# Patient Record
Sex: Male | Born: 1985 | Race: Black or African American | Hispanic: No | Marital: Single
Health system: Southern US, Community
[De-identification: ages and names within clinical notes are randomized; demographics above are authoritative.]

---
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
## 2009-10-13 ENCOUNTER — Emergency Department (HOSPITAL_COMMUNITY): Admission: EM | Admit: 2009-10-13 | Discharge: 2009-10-13 | Payer: Self-pay | Admitting: Emergency Medicine

## 2009-10-13 ENCOUNTER — Inpatient Hospital Stay (HOSPITAL_COMMUNITY): Admission: RE | Admit: 2009-10-13 | Discharge: 2009-10-17 | Payer: Self-pay | Admitting: Psychiatry

## 2009-10-13 ENCOUNTER — Ambulatory Visit: Payer: Self-pay | Admitting: Psychiatry

## 2010-02-27 ENCOUNTER — Emergency Department (HOSPITAL_COMMUNITY): Admission: EM | Admit: 2010-02-27 | Discharge: 2010-02-27 | Payer: Self-pay | Admitting: Emergency Medicine

## 2011-01-14 LAB — CBC
HCT: 40.1 % (ref 39.0–52.0)
MCV: 93.9 fL (ref 78.0–100.0)
Platelets: 185 10*3/uL (ref 150–400)
RDW: 13.5 % (ref 11.5–15.5)
WBC: 5.4 10*3/uL (ref 4.0–10.5)

## 2011-01-14 LAB — COMPREHENSIVE METABOLIC PANEL
ALT: 18 U/L (ref 0–53)
Albumin: 4.1 g/dL (ref 3.5–5.2)
Alkaline Phosphatase: 38 U/L — ABNORMAL LOW (ref 39–117)
Calcium: 9.4 mg/dL (ref 8.4–10.5)
GFR calc non Af Amer: >60 mL/min (ref >60)
Potassium: 3.6 mEq/L (ref 3.5–5.1)
Sodium: 140 mEq/L (ref 135–145)

## 2011-04-09 ENCOUNTER — Ambulatory Visit (INDEPENDENT_AMBULATORY_CARE_PROVIDER_SITE_OTHER): Payer: Self-pay

## 2011-04-09 ENCOUNTER — Inpatient Hospital Stay (INDEPENDENT_AMBULATORY_CARE_PROVIDER_SITE_OTHER)
Admission: RE | Admit: 2011-04-09 | Discharge: 2011-04-09 | Disposition: A | Discharge status: Home / Self Care | Payer: Self-pay | Source: Ambulatory Visit | Attending: Emergency Medicine | Admitting: Emergency Medicine

## 2011-04-09 DIAGNOSIS — S61209A Unspecified open wound of unspecified finger without damage to nail, initial encounter: Secondary | ICD-10-CM

## 2011-04-09 DIAGNOSIS — S6710XA Crushing injury of unspecified finger(s), initial encounter: Secondary | ICD-10-CM

## 2019-12-23 ENCOUNTER — Encounter (HOSPITAL_COMMUNITY)
Admission: RE | Disposition: A | Discharge status: Home / Self Care | Payer: Self-pay | Source: Ambulatory Visit | Attending: Emergency Medicine

## 2019-12-23 ENCOUNTER — Encounter (HOSPITAL_COMMUNITY): Payer: Self-pay | Admitting: Surgery

## 2019-12-23 ENCOUNTER — Emergency Department (HOSPITAL_COMMUNITY): Payer: Self-pay

## 2019-12-23 ENCOUNTER — Emergency Department (HOSPITAL_COMMUNITY): Payer: Self-pay | Admitting: Anesthesiology

## 2019-12-23 ENCOUNTER — Observation Stay (HOSPITAL_COMMUNITY)
Admission: EM | Admit: 2019-12-23 | Discharge: 2019-12-24 | Disposition: A | Discharge status: Home / Self Care | Payer: Self-pay | Source: Home / Self Care | Attending: Emergency Medicine | Admitting: Emergency Medicine

## 2019-12-23 ENCOUNTER — Observation Stay (HOSPITAL_COMMUNITY)
Admission: RE | Admit: 2019-12-23 | Discharge: 2019-12-23 | Disposition: A | Discharge status: Home / Self Care | Payer: Self-pay | Source: Ambulatory Visit | Attending: Emergency Medicine | Admitting: Emergency Medicine

## 2019-12-23 ENCOUNTER — Encounter (HOSPITAL_COMMUNITY): Payer: Self-pay | Admitting: Anesthesiology

## 2019-12-23 ENCOUNTER — Other Ambulatory Visit: Payer: Self-pay

## 2019-12-23 ENCOUNTER — Encounter (HOSPITAL_COMMUNITY)
Admission: EM | Disposition: A | Discharge status: Home / Self Care | Payer: Self-pay | Source: Home / Self Care | Attending: Emergency Medicine

## 2019-12-23 DIAGNOSIS — Z20822 Contact with and (suspected) exposure to covid-19: Secondary | ICD-10-CM | POA: Insufficient documentation

## 2019-12-23 DIAGNOSIS — K3589 Other acute appendicitis without perforation or gangrene: Secondary | ICD-10-CM | POA: Insufficient documentation

## 2019-12-23 DIAGNOSIS — K353 Acute appendicitis with localized peritonitis, without perforation or gangrene: Secondary | ICD-10-CM

## 2019-12-23 DIAGNOSIS — Z79899 Other long term (current) drug therapy: Secondary | ICD-10-CM | POA: Insufficient documentation

## 2019-12-23 DIAGNOSIS — K3533 Acute appendicitis with perforation and localized peritonitis, with abscess: Principal | ICD-10-CM | POA: Insufficient documentation

## 2019-12-23 HISTORY — PX: LAPAROSCOPIC APPENDECTOMY: SHX408

## 2019-12-23 LAB — URINALYSIS, ROUTINE W REFLEX MICROSCOPIC
Bacteria, UA: NONE SEEN
Bilirubin Urine: NEGATIVE
Glucose, UA: NEGATIVE mg/dL
Ketones, ur: NEGATIVE mg/dL
Leukocytes,Ua: NEGATIVE
Nitrite: NEGATIVE
Protein, ur: NEGATIVE mg/dL
Specific Gravity, Urine: 1.01 (ref 1.005–1.030)
pH: 6 (ref 5.0–8.0)

## 2019-12-23 LAB — COMPREHENSIVE METABOLIC PANEL
ALT: 21 U/L (ref 0–44)
AST: 17 U/L (ref 15–41)
Albumin: 4.3 g/dL (ref 3.5–5.0)
Alkaline Phosphatase: 46 U/L (ref 38–126)
Anion gap: 9 (ref 5–15)
BUN: 6 mg/dL (ref 6–20)
CO2: 31 mmol/L (ref 22–32)
Calcium: 9.2 mg/dL (ref 8.9–10.3)
Chloride: 97 mmol/L — ABNORMAL LOW (ref 98–111)
Creatinine, Ser: 1.2 mg/dL (ref 0.61–1.24)
GFR calc Af Amer: >60 mL/min (ref >60)
GFR calc non Af Amer: >60 mL/min (ref >60)
Glucose, Bld: 113 mg/dL — ABNORMAL HIGH (ref 70–99)
Potassium: 3.1 mmol/L — ABNORMAL LOW (ref 3.5–5.1)
Sodium: 137 mmol/L (ref 135–145)
Total Bilirubin: 1.3 mg/dL — ABNORMAL HIGH (ref 0.3–1.2)
Total Protein: 7.5 g/dL (ref 6.5–8.1)

## 2019-12-23 LAB — DIFFERENTIAL
Abs Immature Granulocytes: 0.05 10*3/uL (ref 0.00–0.07)
Basophils Absolute: 0 10*3/uL (ref 0.0–0.1)
Basophils Relative: 0 %
Eosinophils Absolute: 0.1 10*3/uL (ref 0.0–0.5)
Eosinophils Relative: 0 %
Immature Granulocytes: 0 %
Lymphocytes Relative: 9 %
Lymphs Abs: 1.4 10*3/uL (ref 0.7–4.0)
Monocytes Absolute: 1.3 10*3/uL — ABNORMAL HIGH (ref 0.1–1.0)
Monocytes Relative: 8 %
Neutro Abs: 12.6 10*3/uL — ABNORMAL HIGH (ref 1.7–7.7)
Neutrophils Relative %: 83 %

## 2019-12-23 LAB — CBC
HCT: 44.8 % (ref 39.0–52.0)
Hemoglobin: 14.7 g/dL (ref 13.0–17.0)
MCH: 31.2 pg (ref 26.0–34.0)
MCHC: 32.8 g/dL (ref 30.0–36.0)
MCV: 95.1 fL (ref 80.0–100.0)
Platelets: 210 10*3/uL (ref 150–400)
RBC: 4.71 MIL/uL (ref 4.22–5.81)
RDW: 12.2 % (ref 11.5–15.5)
WBC: 15.1 10*3/uL — ABNORMAL HIGH (ref 4.0–10.5)
nRBC: 0 % (ref 0.0–0.2)

## 2019-12-23 LAB — RESPIRATORY PANEL BY RT PCR (FLU A&B, COVID)
Influenza A by PCR: NEGATIVE
Influenza B by PCR: NEGATIVE
SARS Coronavirus 2 by RT PCR: NEGATIVE

## 2019-12-23 LAB — LIPASE, BLOOD: Lipase: 22 U/L (ref 11–51)

## 2019-12-23 SURGERY — APPENDECTOMY, LAPAROSCOPIC
Anesthesia: General

## 2019-12-23 SURGERY — APPENDECTOMY, LAPAROSCOPIC
Anesthesia: General | Site: Abdomen

## 2019-12-23 MED ORDER — SUCCINYLCHOLINE CHLORIDE 200 MG/10ML IV SOSY
PREFILLED_SYRINGE | INTRAVENOUS | Status: DC | PRN
  Administered 2019-12-23: 120 mg via INTRAVENOUS

## 2019-12-23 MED ORDER — PHENYLEPHRINE 40 MCG/ML (10ML) SYRINGE FOR IV PUSH (FOR BLOOD PRESSURE SUPPORT)
PREFILLED_SYRINGE | INTRAVENOUS | Status: AC
  Filled 2019-12-23: qty 10

## 2019-12-23 MED ORDER — GABAPENTIN 300 MG PO CAPS: 300.0000 mg | ORAL_CAPSULE | ORAL | Status: AC

## 2019-12-23 MED ORDER — ACETAMINOPHEN 500 MG PO TABS
1000.0000 mg | ORAL_TABLET | Freq: Three times a day (TID) | ORAL | Status: DC
  Administered 2019-12-24 (×2): 1000 mg via ORAL
  Filled 2019-12-23 (×2): qty 2

## 2019-12-23 MED ORDER — LACTATED RINGERS IV SOLN: INTRAVENOUS | Status: DC

## 2019-12-23 MED ORDER — CELECOXIB 200 MG PO CAPS: 200.0000 mg | ORAL_CAPSULE | ORAL | Status: AC

## 2019-12-23 MED ORDER — OXYCODONE HCL 5 MG PO TABS: 5.0000 mg | ORAL_TABLET | ORAL | Status: DC | PRN

## 2019-12-23 MED ORDER — LIP MEDEX EX OINT
1.0000 "application " | TOPICAL_OINTMENT | Freq: Two times a day (BID) | CUTANEOUS | Status: DC
  Administered 2019-12-24: 1 via TOPICAL
  Filled 2019-12-23: qty 7

## 2019-12-23 MED ORDER — METRONIDAZOLE IN NACL 5-0.79 MG/ML-% IV SOLN
500.0000 mg | Freq: Once | INTRAVENOUS | Status: AC
  Administered 2019-12-23: 500 mg via INTRAVENOUS
  Filled 2019-12-23: qty 100

## 2019-12-23 MED ORDER — ACETAMINOPHEN 500 MG PO TABS: 1000.0000 mg | ORAL_TABLET | ORAL | Status: DC

## 2019-12-23 MED ORDER — SUGAMMADEX SODIUM 500 MG/5ML IV SOLN
INTRAVENOUS | Status: DC | PRN
  Administered 2019-12-23: 300 mg via INTRAVENOUS

## 2019-12-23 MED ORDER — ONDANSETRON 4 MG PO TBDP: 4.0000 mg | ORAL_TABLET | Freq: Four times a day (QID) | ORAL | Status: DC | PRN

## 2019-12-23 MED ORDER — MORPHINE SULFATE (PF) 4 MG/ML IV SOLN
4.0000 mg | INTRAVENOUS | Status: DC | PRN
  Administered 2019-12-23: 16:00:00 4 mg via INTRAVENOUS
  Filled 2019-12-23: qty 1

## 2019-12-23 MED ORDER — PROMETHAZINE HCL 25 MG/ML IJ SOLN: 6.2500 mg | INTRAMUSCULAR | Status: DC | PRN

## 2019-12-23 MED ORDER — GUAIFENESIN-DM 100-10 MG/5ML PO SYRP: 10.0000 mL | ORAL_SOLUTION | ORAL | Status: DC | PRN

## 2019-12-23 MED ORDER — GABAPENTIN 300 MG PO CAPS
300.0000 mg | ORAL_CAPSULE | Freq: Two times a day (BID) | ORAL | Status: DC
  Administered 2019-12-24: 300 mg via ORAL
  Filled 2019-12-23: qty 1

## 2019-12-23 MED ORDER — METHOCARBAMOL 500 MG PO TABS: 500.0000 mg | ORAL_TABLET | Freq: Four times a day (QID) | ORAL | Status: DC | PRN

## 2019-12-23 MED ORDER — DEXAMETHASONE SODIUM PHOSPHATE 10 MG/ML IJ SOLN
INTRAMUSCULAR | Status: AC
  Filled 2019-12-23: qty 1

## 2019-12-23 MED ORDER — PHENYLEPHRINE 40 MCG/ML (10ML) SYRINGE FOR IV PUSH (FOR BLOOD PRESSURE SUPPORT)
PREFILLED_SYRINGE | INTRAVENOUS | Status: DC | PRN
  Administered 2019-12-23: 120 ug via INTRAVENOUS

## 2019-12-23 MED ORDER — MENTHOL 3 MG MT LOZG: 1.0000 | LOZENGE | OROMUCOSAL | Status: DC | PRN

## 2019-12-23 MED ORDER — OXYCODONE HCL 5 MG/5ML PO SOLN: 5.0000 mg | Freq: Once | ORAL | Status: DC | PRN

## 2019-12-23 MED ORDER — MIDAZOLAM HCL 5 MG/5ML IJ SOLN
INTRAMUSCULAR | Status: DC | PRN
  Administered 2019-12-23: 2 mg via INTRAVENOUS

## 2019-12-23 MED ORDER — PHENOL 1.4 % MT LIQD: 1.0000 | OROMUCOSAL | Status: DC | PRN

## 2019-12-23 MED ORDER — MIDAZOLAM HCL 2 MG/2ML IJ SOLN
INTRAMUSCULAR | Status: AC
  Filled 2019-12-23: qty 2

## 2019-12-23 MED ORDER — FENTANYL CITRATE (PF) 100 MCG/2ML IJ SOLN: 25.0000 ug | INTRAMUSCULAR | Status: DC | PRN

## 2019-12-23 MED ORDER — SIMETHICONE 80 MG PO CHEW: 40.0000 mg | CHEWABLE_TABLET | Freq: Four times a day (QID) | ORAL | Status: DC | PRN

## 2019-12-23 MED ORDER — MAGIC MOUTHWASH
15.0000 mL | Freq: Four times a day (QID) | ORAL | Status: DC | PRN
  Filled 2019-12-23: qty 15

## 2019-12-23 MED ORDER — CHLORHEXIDINE GLUCONATE CLOTH 2 % EX PADS: 6.0000 | MEDICATED_PAD | Freq: Once | CUTANEOUS | Status: DC

## 2019-12-23 MED ORDER — MAGNESIUM HYDROXIDE 400 MG/5ML PO SUSP: 30.0000 mL | Freq: Three times a day (TID) | ORAL | Status: DC | PRN

## 2019-12-23 MED ORDER — PROCHLORPERAZINE EDISYLATE 10 MG/2ML IJ SOLN: 5.0000 mg | INTRAMUSCULAR | Status: DC | PRN

## 2019-12-23 MED ORDER — SODIUM CHLORIDE 0.9 % IV SOLN: 2.0000 g | INTRAVENOUS | Status: AC

## 2019-12-23 MED ORDER — IOHEXOL 300 MG/ML  SOLN
100.0000 mL | Freq: Once | INTRAMUSCULAR | Status: AC | PRN
  Administered 2019-12-23: 100 mL via INTRAVENOUS

## 2019-12-23 MED ORDER — SODIUM CHLORIDE 0.9 % IV SOLN
2.0000 g | Freq: Once | INTRAVENOUS | Status: AC
  Administered 2019-12-23: 2 g via INTRAVENOUS
  Filled 2019-12-23: qty 20

## 2019-12-23 MED ORDER — ENOXAPARIN SODIUM 40 MG/0.4ML ~~LOC~~ SOLN: 40.0000 mg | SUBCUTANEOUS | Status: DC

## 2019-12-23 MED ORDER — METRONIDAZOLE IN NACL 5-0.79 MG/ML-% IV SOLN
500.0000 mg | Freq: Three times a day (TID) | INTRAVENOUS | Status: DC
  Administered 2019-12-24: 500 mg via INTRAVENOUS
  Filled 2019-12-23: qty 100

## 2019-12-23 MED ORDER — ACETAMINOPHEN 500 MG PO TABS
1000.0000 mg | ORAL_TABLET | ORAL | Status: AC
  Administered 2019-12-23: 20:00:00 1000 mg via ORAL

## 2019-12-23 MED ORDER — ONDANSETRON HCL 4 MG/2ML IJ SOLN
INTRAMUSCULAR | Status: AC
  Filled 2019-12-23: qty 2

## 2019-12-23 MED ORDER — ROCURONIUM BROMIDE 10 MG/ML (PF) SYRINGE
PREFILLED_SYRINGE | INTRAVENOUS | Status: AC
  Filled 2019-12-23: qty 10

## 2019-12-23 MED ORDER — PROPOFOL 10 MG/ML IV BOLUS
INTRAVENOUS | Status: DC | PRN
  Administered 2019-12-23: 200 mg via INTRAVENOUS

## 2019-12-23 MED ORDER — SODIUM CHLORIDE (PF) 0.9 % IJ SOLN
INTRAMUSCULAR | Status: AC
  Filled 2019-12-23: qty 50

## 2019-12-23 MED ORDER — HYDROCORTISONE 1 % EX CREA
1.0000 "application " | TOPICAL_CREAM | Freq: Three times a day (TID) | CUTANEOUS | Status: DC | PRN
  Filled 2019-12-23: qty 28

## 2019-12-23 MED ORDER — LIDOCAINE 2% (20 MG/ML) 5 ML SYRINGE
INTRAMUSCULAR | Status: DC | PRN
  Administered 2019-12-23: 100 mg via INTRAVENOUS

## 2019-12-23 MED ORDER — SUCCINYLCHOLINE CHLORIDE 200 MG/10ML IV SOSY
PREFILLED_SYRINGE | INTRAVENOUS | Status: AC
  Filled 2019-12-23: qty 10

## 2019-12-23 MED ORDER — ACETAMINOPHEN 500 MG PO TABS
ORAL_TABLET | ORAL | Status: AC
  Filled 2019-12-23: qty 2

## 2019-12-23 MED ORDER — LIDOCAINE 2% (20 MG/ML) 5 ML SYRINGE
INTRAMUSCULAR | Status: AC
  Filled 2019-12-23: qty 10

## 2019-12-23 MED ORDER — ONDANSETRON HCL 4 MG/2ML IJ SOLN
4.0000 mg | Freq: Once | INTRAMUSCULAR | Status: AC
  Administered 2019-12-23: 4 mg via INTRAVENOUS
  Filled 2019-12-23: qty 2

## 2019-12-23 MED ORDER — DEXAMETHASONE SODIUM PHOSPHATE 10 MG/ML IJ SOLN
INTRAMUSCULAR | Status: DC | PRN
  Administered 2019-12-23: 10 mg via INTRAVENOUS

## 2019-12-23 MED ORDER — ALUM & MAG HYDROXIDE-SIMETH 200-200-20 MG/5ML PO SUSP: 30.0000 mL | Freq: Four times a day (QID) | ORAL | Status: DC | PRN

## 2019-12-23 MED ORDER — HYDROCORTISONE (PERIANAL) 2.5 % EX CREA
1.0000 "application " | TOPICAL_CREAM | Freq: Four times a day (QID) | CUTANEOUS | Status: DC | PRN
  Filled 2019-12-23: qty 28.35

## 2019-12-23 MED ORDER — SUGAMMADEX SODIUM 500 MG/5ML IV SOLN
INTRAVENOUS | Status: AC
  Filled 2019-12-23: qty 5

## 2019-12-23 MED ORDER — ONDANSETRON HCL 4 MG/2ML IJ SOLN
INTRAMUSCULAR | Status: DC | PRN
  Administered 2019-12-23: 4 mg via INTRAVENOUS

## 2019-12-23 MED ORDER — OXYCODONE HCL 5 MG PO TABS: 5.0000 mg | ORAL_TABLET | Freq: Once | ORAL | Status: DC | PRN

## 2019-12-23 MED ORDER — SODIUM CHLORIDE 0.9% FLUSH
3.0000 mL | Freq: Once | INTRAVENOUS | Status: AC
  Administered 2019-12-23: 16:00:00 3 mL via INTRAVENOUS

## 2019-12-23 MED ORDER — DIPHENHYDRAMINE HCL 50 MG/ML IJ SOLN: 12.5000 mg | Freq: Four times a day (QID) | INTRAMUSCULAR | Status: DC | PRN

## 2019-12-23 MED ORDER — BUPIVACAINE LIPOSOME 1.3 % IJ SUSP
20.0000 mL | Freq: Once | INTRAMUSCULAR | Status: DC
  Filled 2019-12-23: qty 20

## 2019-12-23 MED ORDER — BISACODYL 10 MG RE SUPP: 10.0000 mg | Freq: Every day | RECTAL | Status: DC | PRN

## 2019-12-23 MED ORDER — ONDANSETRON HCL 4 MG/2ML IJ SOLN: 4.0000 mg | Freq: Four times a day (QID) | INTRAMUSCULAR | Status: DC | PRN

## 2019-12-23 MED ORDER — DIPHENHYDRAMINE HCL 12.5 MG/5ML PO ELIX: 12.5000 mg | ORAL_SOLUTION | Freq: Four times a day (QID) | ORAL | Status: DC | PRN

## 2019-12-23 MED ORDER — LACTATED RINGERS IR SOLN
Status: DC | PRN
  Administered 2019-12-23: 1000 mL

## 2019-12-23 MED ORDER — ZOLPIDEM TARTRATE 5 MG PO TABS: 5.0000 mg | ORAL_TABLET | Freq: Every evening | ORAL | Status: DC | PRN

## 2019-12-23 MED ORDER — LACTATED RINGERS IV BOLUS: 1000.0000 mL | Freq: Three times a day (TID) | INTRAVENOUS | Status: DC | PRN

## 2019-12-23 MED ORDER — SODIUM CHLORIDE 0.9 % IV SOLN: 250.0000 mL | INTRAVENOUS | Status: DC | PRN

## 2019-12-23 MED ORDER — GABAPENTIN 300 MG PO CAPS
ORAL_CAPSULE | ORAL | Status: AC
  Administered 2019-12-23: 300 mg via ORAL
  Filled 2019-12-23: qty 1

## 2019-12-23 MED ORDER — FENTANYL CITRATE (PF) 250 MCG/5ML IJ SOLN
INTRAMUSCULAR | Status: AC
  Filled 2019-12-23: qty 5

## 2019-12-23 MED ORDER — SODIUM CHLORIDE 0.9% FLUSH
3.0000 mL | Freq: Two times a day (BID) | INTRAVENOUS | Status: DC
  Administered 2019-12-23: 3 mL via INTRAVENOUS

## 2019-12-23 MED ORDER — SODIUM CHLORIDE 0.9% FLUSH: 3.0000 mL | INTRAVENOUS | Status: DC | PRN

## 2019-12-23 MED ORDER — CELECOXIB 200 MG PO CAPS
ORAL_CAPSULE | ORAL | Status: AC
  Administered 2019-12-23: 20:00:00 200 mg via ORAL
  Filled 2019-12-23: qty 1

## 2019-12-23 MED ORDER — AMPHETAMINE-DEXTROAMPHETAMINE 10 MG PO TABS: 20.0000 mg | ORAL_TABLET | Freq: Every day | ORAL | Status: DC

## 2019-12-23 MED ORDER — FENTANYL CITRATE (PF) 250 MCG/5ML IJ SOLN
INTRAMUSCULAR | Status: DC | PRN
  Administered 2019-12-23 (×2): 100 ug via INTRAVENOUS

## 2019-12-23 MED ORDER — HYDROMORPHONE HCL 1 MG/ML IJ SOLN: 0.5000 mg | INTRAMUSCULAR | Status: DC | PRN

## 2019-12-23 MED ORDER — KETOROLAC TROMETHAMINE 30 MG/ML IJ SOLN: 30.0000 mg | Freq: Once | INTRAMUSCULAR | Status: DC | PRN

## 2019-12-23 MED ORDER — ROCURONIUM BROMIDE 10 MG/ML (PF) SYRINGE
PREFILLED_SYRINGE | INTRAVENOUS | Status: DC | PRN
  Administered 2019-12-23: 50 mg via INTRAVENOUS

## 2019-12-23 MED ORDER — BUPIVACAINE HCL 0.25 % IJ SOLN
INTRAMUSCULAR | Status: AC
  Filled 2019-12-23: qty 1

## 2019-12-23 SURGICAL SUPPLY — 52 items
APL PRP STRL LF DISP 70% ISPRP (MISCELLANEOUS) ×1
APPLIER CLIP 5 13 M/L LIGAMAX5 (MISCELLANEOUS)
APPLIER CLIP ROT 10 11.4 M/L (STAPLE)
APR CLP MED LRG 11.4X10 (STAPLE)
APR CLP MED LRG 5 ANG JAW (MISCELLANEOUS)
BAG SPEC RTRVL 10 TROC 200 (ENDOMECHANICALS) ×1
CABLE HIGH FREQUENCY MONO STRZ (ELECTRODE) ×2 IMPLANT
CHLORAPREP W/TINT 26 (MISCELLANEOUS) ×2 IMPLANT
CLIP APPLIE 5 13 M/L LIGAMAX5 (MISCELLANEOUS) IMPLANT
CLIP APPLIE ROT 10 11.4 M/L (STAPLE) IMPLANT
COVER SURGICAL LIGHT HANDLE (MISCELLANEOUS) ×2 IMPLANT
COVER WAND RF STERILE (DRAPES) IMPLANT
CUTTER FLEX LINEAR 45M (STAPLE) ×1 IMPLANT
DECANTER SPIKE VIAL GLASS SM (MISCELLANEOUS) ×2 IMPLANT
DEVICE TROCAR PUNCTURE CLOSURE (ENDOMECHANICALS) IMPLANT
DRAPE LAPAROSCOPIC ABDOMINAL (DRAPES) ×2 IMPLANT
DRAPE WARM FLUID 44X44 (DRAPES) ×2 IMPLANT
DRSG TEGADERM 2-3/8X2-3/4 SM (GAUZE/BANDAGES/DRESSINGS) ×3 IMPLANT
DRSG TEGADERM 4X4.75 (GAUZE/BANDAGES/DRESSINGS) ×4 IMPLANT
ELECT REM PT RETURN 15FT ADLT (MISCELLANEOUS) ×2 IMPLANT
ENDOLOOP SUT PDS II  0 18 (SUTURE)
ENDOLOOP SUT PDS II 0 18 (SUTURE) IMPLANT
GAUZE SPONGE 2X2 8PLY STRL LF (GAUZE/BANDAGES/DRESSINGS) ×1 IMPLANT
GLOVE ECLIPSE 8.0 STRL XLNG CF (GLOVE) ×2 IMPLANT
GLOVE INDICATOR 8.0 STRL GRN (GLOVE) ×2 IMPLANT
GOWN STRL REUS W/TWL XL LVL3 (GOWN DISPOSABLE) ×4 IMPLANT
IRRIG SUCT STRYKERFLOW 2 WTIP (MISCELLANEOUS) ×2
IRRIGATION SUCT STRKRFLW 2 WTP (MISCELLANEOUS) ×1 IMPLANT
KIT BASIN OR (CUSTOM PROCEDURE TRAY) ×2 IMPLANT
KIT TURNOVER KIT A (KITS) IMPLANT
PAD POSITIONING PINK XL (MISCELLANEOUS) ×2 IMPLANT
PENCIL SMOKE EVACUATOR (MISCELLANEOUS) IMPLANT
POUCH RETRIEVAL ECOSAC 10 (ENDOMECHANICALS) ×1 IMPLANT
POUCH RETRIEVAL ECOSAC 10MM (ENDOMECHANICALS) ×2
RELOAD 45 VASCULAR/THIN (ENDOMECHANICALS) IMPLANT
RELOAD STAPLE 45 2.5 WHT GRN (ENDOMECHANICALS) IMPLANT
RELOAD STAPLE 45 3.5 BLU ETS (ENDOMECHANICALS) IMPLANT
RELOAD STAPLE TA45 3.5 REG BLU (ENDOMECHANICALS) ×2 IMPLANT
SCISSORS LAP 5X35 DISP (ENDOMECHANICALS) ×3 IMPLANT
SET TUBE SMOKE EVAC HIGH FLOW (TUBING) ×2 IMPLANT
SHEARS HARMONIC ACE PLUS 36CM (ENDOMECHANICALS) IMPLANT
SLEEVE XCEL OPT CAN 5 100 (ENDOMECHANICALS) ×2 IMPLANT
SPONGE GAUZE 2X2 STER 10/PKG (GAUZE/BANDAGES/DRESSINGS) ×1
SUT MNCRL AB 4-0 PS2 18 (SUTURE) ×2 IMPLANT
SUT PDS AB 0 CT1 36 (SUTURE) IMPLANT
SUT PDS AB 1 CT1 27 (SUTURE) IMPLANT
SUT SILK 2 0 SH (SUTURE) IMPLANT
TOWEL OR 17X26 10 PK STRL BLUE (TOWEL DISPOSABLE) ×2 IMPLANT
TRAY FOLEY MTR SLVR 16FR STAT (SET/KITS/TRAYS/PACK) IMPLANT
TRAY LAPAROSCOPIC (CUSTOM PROCEDURE TRAY) ×2 IMPLANT
TROCAR BLADELESS OPT 5 100 (ENDOMECHANICALS) ×2 IMPLANT
TROCAR XCEL 12X100 BLDLESS (ENDOMECHANICALS) ×2 IMPLANT

## 2019-12-23 NOTE — Discharge Instructions (Signed)
SURGERY: POST OP INSTRUCTIONS °(Surgery for small bowel obstruction, colon resection, etc) ° ° °###################################################################### ° °EAT °Gradually transition to a high fiber diet with a fiber supplement over the next few days after discharge ° °WALK °Walk an hour a day.  Control your pain to do that.   ° °CONTROL PAIN °Control pain so that you can walk, sleep, tolerate sneezing/coughing, go up/down stairs. ° °HAVE A BOWEL MOVEMENT DAILY °Keep your bowels regular to avoid problems.  OK to try a laxative to override constipation.  OK to use an antidairrheal to slow down diarrhea.  Call if not better after 2 tries ° °CALL IF YOU HAVE PROBLEMS/CONCERNS °Call if you are still struggling despite following these instructions. °Call if you have concerns not answered by these instructions ° °###################################################################### ° ° °DIET °Follow a light diet the first few days at home.  Start with a bland diet such as soups, liquids, starchy foods, low fat foods, etc.  If you feel full, bloated, or constipated, stay on a ful liquid or pureed/blenderized diet for a few days until you feel better and no longer constipated. °Be sure to drink plenty of fluids every day to avoid getting dehydrated (feeling dizzy, not urinating, etc.). °Gradually add a fiber supplement to your diet over the next week.  Gradually get back to a regular solid diet.  Avoid fast food or heavy meals the first week as you are more likely to get nauseated. °It is expected for your digestive tract to need a few months to get back to normal.  It is common for your bowel movements and stools to be irregular.  You will have occasional bloating and cramping that should eventually fade away.  Until you are eating solid food normally, off all pain medications, and back to regular activities; your bowels will not be normal. °Focus on eating a low-fat, high fiber diet the rest of your life  (See Getting to Good Bowel Health, below). ° °CARE of your INCISION or WOUND °It is good for closed incision and even open wounds to be washed every day.  Shower every day.  Short baths are fine.  Wash the incisions and wounds clean with soap & water.    °If you have a closed incision(s), wash the incision with soap & water every day.  You may leave closed incisions open to air if it is dry.   You may cover the incision with clean gauze & replace it after your daily shower for comfort. °If you have skin tapes (Steristrips) or skin glue (Dermabond) on your incision, leave them in place.  They will fall off on their own like a scab.  You may trim any edges that curl up with clean scissors.  If you have staples, set up an appointment for them to be removed in the office in 10 days after surgery.  °If you have a drain, wash around the skin exit site with soap & water and place a new dressing of gauze or band aid around the skin every day.  Keep the drain site clean & dry.    °If you have an open wound with packing, see wound care instructions.  In general, it is encouraged that you remove your dressing and packing, shower with soap & water, and replace your dressing once a day.  Pack the wound with clean gauze moistened with normal (0.9%) saline to keep the wound moist & uninfected.  Pressure on the dressing for 30 minutes will stop most wound   bleeding.  Eventually your body will heal & pull the open wound closed over the next few months.  °Raw open wounds will occasionally bleed or secrete yellow drainage until it heals closed.  Drain sites will drain a little until the drain is removed.  Even closed incisions can have mild bleeding or drainage the first few days until the skin edges scab over & seal.   °If you have an open wound with a wound vac, see wound vac care instructions. ° ° ° ° °ACTIVITIES as tolerated °Start light daily activities --- self-care, walking, climbing stairs-- beginning the day after surgery.   Gradually increase activities as tolerated.  Control your pain to be active.  Stop when you are tired.  Ideally, walk several times a day, eventually an hour a day.   °Most people are back to most day-to-day activities in a few weeks.  It takes 4-8 weeks to get back to unrestricted, intense activity. °If you can walk 30 minutes without difficulty, it is safe to try more intense activity such as jogging, treadmill, bicycling, low-impact aerobics, swimming, etc. °Save the most intensive and strenuous activity for last (Usually 4-8 weeks after surgery) such as sit-ups, heavy lifting, contact sports, etc.  Refrain from any intense heavy lifting or straining until you are off narcotics for pain control.  You will have off days, but things should improve week-by-week. °DO NOT PUSH THROUGH PAIN.  Let pain be your guide: If it hurts to do something, don't do it.  Pain is your body warning you to avoid that activity for another week until the pain goes down. °You may drive when you are no longer taking narcotic prescription pain medication, you can comfortably wear a seatbelt, and you can safely make sudden turns/stops to protect yourself without hesitating due to pain. °You may have sexual intercourse when it is comfortable. If it hurts to do something, stop. ° °MEDICATIONS °Take your usually prescribed home medications unless otherwise directed.   °Blood thinners:  °Usually you can restart any strong blood thinners after the second postoperative day.  It is OK to take aspirin right away.    ° If you are on strong blood thinners (warfarin/Coumadin, Plavix, Xerelto, Eliquis, Pradaxa, etc), discuss with your surgeon, medicine PCP, and/or cardiologist for instructions on when to restart the blood thinner & if blood monitoring is needed (PT/INR blood check, etc).   ° ° °PAIN CONTROL °Pain after surgery or related to activity is often due to strain/injury to muscle, tendon, nerves and/or incisions.  This pain is usually  short-term and will improve in a few months.  °To help speed the process of healing and to get back to regular activity more quickly, DO THE FOLLOWING THINGS TOGETHER: °1. Increase activity gradually.  DO NOT PUSH THROUGH PAIN °2. Use Ice and/or Heat °3. Try Gentle Massage and/or Stretching °4. Take over the counter pain medication °5. Take Narcotic prescription pain medication for more severe pain ° °Good pain control = faster recovery.  It is better to take more medicine to be more active than to stay in bed all day to avoid medications. °1.  Increase activity gradually °Avoid heavy lifting at first, then increase to lifting as tolerated over the next 6 weeks. °Do not “push through” the pain.  Listen to your body and avoid positions and maneuvers than reproduce the pain.  Wait a few days before trying something more intense °Walking an hour a day is encouraged to help your body recover faster   and more safely.  Start slowly and stop when getting sore.  If you can walk 30 minutes without stopping or pain, you can try more intense activity (running, jogging, aerobics, cycling, swimming, treadmill, sex, sports, weightlifting, etc.) °Remember: If it hurts to do it, then don’t do it! °2. Use Ice and/or Heat °You will have swelling and bruising around the incisions.  This will take several weeks to resolve. °Ice packs or heating pads (6-8 times a day, 30-60 minutes at a time) will help sooth soreness & bruising. °Some people prefer to use ice alone, heat alone, or alternate between ice & heat.  Experiment and see what works best for you.  Consider trying ice for the first few days to help decrease swelling and bruising; then, switch to heat to help relax sore spots and speed recovery. °Shower every day.  Short baths are fine.  It feels good!  Keep the incisions and wounds clean with soap & water.   °3. Try Gentle Massage and/or Stretching °Massage at the area of pain many times a day °Stop if you feel pain - do not  overdo it °4. Take over the counter pain medication °This helps the muscle and nerve tissues become less irritable and calm down faster °Choose ONE of the following over-the-counter anti-inflammatory medications: °Acetaminophen 500mg tabs (Tylenol) 1-2 pills with every meal and just before bedtime (avoid if you have liver problems or if you have acetaminophen in you narcotic prescription) °Naproxen 220mg tabs (ex. Aleve, Naprosyn) 1-2 pills twice a day (avoid if you have kidney, stomach, IBD, or bleeding problems) °Ibuprofen 200mg tabs (ex. Advil, Motrin) 3-4 pills with every meal and just before bedtime (avoid if you have kidney, stomach, IBD, or bleeding problems) °Take with food/snack several times a day as directed for at least 2 weeks to help keep pain / soreness down & more manageable. °5. Take Narcotic prescription pain medication for more severe pain °A prescription for strong pain control is often given to you upon discharge (for example: oxycodone/Percocet, hydrocodone/Norco/Vicodin, or tramadol/Ultram) °Take your pain medication as prescribed. °Be mindful that most narcotic prescriptions contain Tylenol (acetaminophen) as well - avoid taking too much Tylenol. °If you are having problems/concerns with the prescription medicine (does not control pain, nausea, vomiting, rash, itching, etc.), please call us [phone] to see if we need to switch you to a different pain medicine that will work better for you and/or control your side effects better. °If you need a refill on your pain medication, you must call the office before 4 pm and on weekdays only.  By federal law, prescriptions for narcotics cannot be called into a pharmacy.  They must be filled out on paper & picked up from our office by the patient or authorized caretaker.  Prescriptions cannot be filled after 4 pm nor on weekends.   ° °WHEN TO CALL US [phone] °Severe uncontrolled or worsening pain  °Fever over 101 F (38.5 C) °Concerns with  the incision: Worsening pain, redness, rash/hives, swelling, bleeding, or drainage °Reactions / problems with new medications (itching, rash, hives, nausea, etc.) °Nausea and/or vomiting °Difficulty urinating °Difficulty breathing °Worsening fatigue, dizziness, lightheadedness, blurred vision °Other concerns °If you are not getting better after two weeks or are noticing you are getting worse, contact our office [phone] for further advice.  We may need to adjust your medications, re-evaluate you in the office, send you to the emergency room, or see what other things we can do to help. °The   clinic staff is available to answer your questions during regular business hours (8:30am-5pm).  Please don’t hesitate to call and ask to speak to one of our nurses for clinical concerns.    °A surgeon from [facility] is always on call at the hospitals 24 hours/day °If you have a medical emergency, go to the nearest emergency room or call 911. ° °FOLLOW UP in our office °One the day of your discharge from the hospital (or the next business weekday), please call [facility] to set up or confirm an appointment to see your surgeon in the office for a follow-up appointment.  Usually it is 2-3 weeks after your surgery.   °If you have skin staples at your incision(s), let the office know so we can set up a time in the office for the nurse to remove them (usually around 10 days after surgery). °Make sure that you call for appointments the day of discharge (or the next business weekday) from the hospital to ensure a convenient appointment time. °IF YOU HAVE DISABILITY OR FAMILY LEAVE FORMS, BRING THEM TO THE OFFICE FOR PROCESSING.  DO NOT GIVE THEM TO YOUR DOCTOR. ° °[facility] °[address], [address], [place], [place]  [address] ? °[phone] - Main °[phone] - Toll Free,  [phone] - Fax °[email or web address] ° °GETTING TO GOOD BOWEL HEALTH. °It is  expected for your digestive tract to need a few months to get back to normal.  It is common for your bowel movements and stools to be irregular.  You will have occasional bloating and cramping that should eventually fade away.  Until you are eating solid food normally, off all pain medications, and back to regular activities; your bowels will not be normal.   °Avoiding constipation °The goal: ONE SOFT BOWEL MOVEMENT A DAY!    °Drink plenty of fluids.  Choose water first. °TAKE A FIBER SUPPLEMENT EVERY DAY THE REST OF YOUR LIFE °During your first week back home, gradually add back a fiber supplement every day °Experiment which form you can tolerate.   There are many forms such as powders, tablets, wafers, gummies, etc °Psyllium bran (Metamucil), methylcellulose (Citrucel), Miralax or Glycolax, Benefiber, Flax Seed.  °Adjust the dose week-by-week (1/2 dose/day to 6 doses a day) until you are moving your bowels 1-2 times a day.  Cut back the dose or try a different fiber product if it is giving you problems such as diarrhea or bloating. °Sometimes a laxative is needed to help jump-start bowels if constipated until the fiber supplement can help regulate your bowels.  If you are tolerating eating & you are farting, it is okay to try a gentle laxative such as double dose MiraLax, prune juice, or Milk of Magnesia.  Avoid using laxatives too often. °Stool softeners can sometimes help counteract the constipating effects of narcotic pain medicines.  It can also cause diarrhea, so avoid using for too long. °If you are still constipated despite taking fiber daily, eating solids, and a few doses of laxatives, call our office. °Controlling diarrhea °Try drinking liquids and eating bland foods for a few days to avoid stressing your intestines further. °Avoid dairy products (especially milk & ice cream) for a short time.  The intestines often can lose the ability to digest lactose when stressed. °Avoid foods that cause gassiness or  bloating.  Typical foods include beans and other legumes, cabbage, broccoli, and dairy foods.  Avoid greasy, spicy, fast foods.  Every person has   some sensitivity to other foods, so listen to your body and avoid those foods that trigger problems for you. °Probiotics (such as active yogurt, Align, etc) may help repopulate the intestines and colon with normal bacteria and calm down a sensitive digestive tract °Adding a fiber supplement gradually can help thicken stools by absorbing excess fluid and retrain the intestines to act more normally.  Slowly increase the dose over a few weeks.  Too much fiber too soon can backfire and cause cramping & bloating. °It is okay to try and slow down diarrhea with a few doses of antidiarrheal medicines.   °Bismuth subsalicylate (ex. Kayopectate, Pepto Bismol) for a few doses can help control diarrhea.  Avoid if pregnant.   °Loperamide (Imodium) can slow down diarrhea.  Start with one tablet (2mg) first.  Avoid if you are having fevers or severe pain.  °ILEOSTOMY PATIENTS WILL HAVE CHRONIC DIARRHEA since their colon is not in use.    °Drink plenty of liquids.  You will need to drink even more glasses of water/liquid a day to avoid getting dehydrated. °Record output from your ileostomy.  Expect to empty the bag every 3-4 hours at first.  Most people with a permanent ileostomy empty their bag 4-6 times at the least.   °Use antidiarrheal medicine (especially Imodium) several times a day to avoid getting dehydrated.  Start with a dose at bedtime & breakfast.  Adjust up or down as needed.  Increase antidiarrheal medications as directed to avoid emptying the bag more than 8 times a day (every 3 hours). °Work with your wound ostomy nurse to learn care for your ostomy.  See ostomy care instructions. °TROUBLESHOOTING IRREGULAR BOWELS °1) Start with a soft & bland diet. No spicy, greasy, or fried foods.  °2) Avoid gluten/wheat or dairy products from diet to see if symptoms improve. °3) Miralax  17gm or flax seed mixed in 8oz. water or juice-daily. May use 2-4 times a day as needed. °4) Gas-X, Phazyme, etc. as needed for gas & bloating.  °5) Prilosec (omeprazole) over-the-counter as needed °6)  Consider probiotics (Align, Activa, etc) to help calm the bowels down ° °Call your doctor if you are getting worse or not getting better.  Sometimes further testing (cultures, endoscopy, X-ray studies, CT scans, bloodwork, etc.) may be needed to help diagnose and treat the cause of the diarrhea. °[facility] °[address], [address], [place], [place]  [address] °[phone] - Main.    °[phone]  - Toll Free.   [phone] - Fax °[email or web address] ° ° °

## 2019-12-23 NOTE — Plan of Care (Signed)
  Problem: Skin Integrity: Goal: Risk for impaired skin integrity will decrease Outcome: Progressing   Problem: Pain Managment: Goal: General experience of comfort will improve Outcome: Progressing   Problem: Elimination: Goal: Will not experience complications related to urinary retention Outcome: Progressing   Problem: Coping: Goal: Level of anxiety will decrease Outcome: Progressing   Problem: Nutrition: Goal: Adequate nutrition will be maintained Outcome: Progressing

## 2019-12-23 NOTE — Transfer of Care (Signed)
Immediate Anesthesia Transfer of Care Note  Patient: Brad Walters  Procedure(s) Performed: APPENDECTOMY LAPAROSCOPIC (N/A Abdomen)  Patient Location: PACU  Anesthesia Type:General  Level of Consciousness: drowsy and responds to stimulation  Airway & Oxygen Therapy: Patient Spontanous Breathing and Patient connected to face mask oxygen  Post-op Assessment: Report given to RN and Post -op Vital signs reviewed and stable  Post vital signs: Reviewed and stable  Last Vitals:  Vitals Value Taken Time  BP 107/70 12/23/19 2046  Temp    Pulse    Resp 14 12/23/19 2048  SpO2    Vitals shown include unvalidated device data.  Last Pain:  Vitals:   12/23/19 1741  TempSrc:   PainSc: 8          Complications: No apparent anesthesia complications

## 2019-12-23 NOTE — Anesthesia Preprocedure Evaluation (Signed)
Anesthesia Evaluation  Patient identified by MRN, date of birth, ID band Patient awake    Reviewed: Allergy & Precautions, NPO status , Patient's Chart, lab work & pertinent test results  Airway Mallampati: II  TM Distance: >3 FB Neck ROM: Full    Dental no notable dental hx.    Pulmonary neg pulmonary ROS,    Pulmonary exam normal breath sounds clear to auscultation       Cardiovascular negative cardio ROS Normal cardiovascular exam Rhythm:Regular Rate:Normal     Neuro/Psych negative neurological ROS  negative psych ROS   GI/Hepatic negative GI ROS, Neg liver ROS,   Endo/Other  negative endocrine ROS  Renal/GU negative Renal ROS  negative genitourinary   Musculoskeletal negative musculoskeletal ROS (+)   Abdominal   Peds negative pediatric ROS (+)  Hematology negative hematology ROS (+)   Anesthesia Other Findings   Reproductive/Obstetrics negative OB ROS                             Anesthesia Physical Anesthesia Plan  ASA: I  Anesthesia Plan: General   Post-op Pain Management:    Induction: Intravenous and Rapid sequence  PONV Risk Score and Plan: 2 and Ondansetron, Dexamethasone and Treatment may vary due to age or medical condition  Airway Management Planned: Oral ETT  Additional Equipment:   Intra-op Plan:   Post-operative Plan: Extubation in OR  Informed Consent: I have reviewed the patients History and Physical, chart, labs and discussed the procedure including the risks, benefits and alternatives for the proposed anesthesia with the patient or authorized representative who has indicated his/her understanding and acceptance.     Dental advisory given  Plan Discussed with: CRNA and Surgeon  Anesthesia Plan Comments:         Anesthesia Quick Evaluation  

## 2019-12-23 NOTE — Op Note (Signed)
12/23/2019  PATIENT:  Brad Walters  34 y.o. male  Patient Care Team: Patient, No Pcp Per as PCP - General (General Practice)  PRE-OPERATIVE DIAGNOSIS:  Acute appendicitis  POST-OPERATIVE DIAGNOSIS:  Acute perforated appendicitis with phlegmon  PROCEDURE:  APPENDECTOMY LAPAROSCOPIC  SURGEON:  Ardeth Sportsman, MD  ANESTHESIA:   local and general  EBL:  Total I/O In: 100 [I.V.:100] Out: 30 [Urine:25; Blood:5]  Delay start of Pharmacological VTE agent (>24hrs) due to surgical blood loss or risk of bleeding:  no  DRAINS: none   SPECIMEN:  APPENDIX  DISPOSITION OF SPECIMEN:  PATHOLOGY  COUNTS:  YES  PLAN OF CARE: Admit for overnight observation  PATIENT DISPOSITION:  PACU - hemodynamically stable.   INDICATIONS: Patient with concerning symptoms & work up suspicious for appendicitis.  Surgery was recommended:  The anatomy & physiology of the digestive tract was discussed.  The pathophysiology of appendicitis was discussed.  Natural history risks without surgery was discussed.   I feel the risks of no intervention will lead to serious problems that outweigh the operative risks; therefore, I recommended diagnostic laparoscopy with removal of appendix to remove the pathology.  Laparoscopic & open techniques were discussed.   I noted a good likelihood this will help address the problem.    Risks such as bleeding, infection, abscess, leak, reoperation, possible ostomy, hernia, heart attack, death, and other risks were discussed.  Goals of post-operative recovery were discussed as well.  We will work to minimize complications.  Questions were answered.  The patient expresses understanding & wishes to proceed with surgery.  OR FINDINGS: Appendix adherent to cecum and ascending colon with obvious phlegmon.  Some early necrosis suspicious for perforation.  Focal peritonitis with purulence.  No discrete abscess.  DESCRIPTION:   The patient was identified & brought into the  operating room. The patient was positioned supine with arms tucked. SCDs were active during the entire case. The patient underwent general anesthesia without any difficulty.  The abdomen was prepped and draped in a sterile fashion. A Surgical Timeout confirmed our plan.  I made a transverse incision through the superior umbilical fold.  I made a small transverse nick through the infraumbilical fascia and confirmed peritoneal entry.  I placed a 4mm port.  We induced carbon dioxide insufflation.  Camera inspection revealed no injury.  I placed additional ports under direct laparoscopic visualization.  I could see no obvious phlegmon in the right lower quadrant along the ascending colon suspicious for phlegmonous appendicitis.  I mobilized the terminal ileum to proximal ascending colon in a lateral to medial fashion.  I took care to avoid injuring any retroperitoneal structures.  I freed the appendix off its attachments to the ascending colon and cecal mesentery.  I elevated the appendix. I skeletonized and transected through the the mesoappendix. I was able to free off the base of the appendix which was still viable.  I stapled the appendix off the cecum using a laparoscopic stapler. I took a healthy cuff of viable cecum. I ligated the mesoappendix and assured hemostasis in the mesentery.  I placed the appendix inside an EcoSac bag and removed out the left suprapubic 12 mm port.  I did copious irrigation. Hemostasis was good in the mesoappendix, colon mesentery, and retroperitoneum. Staple line was intact on the cecum with no bleeding. I washed out the pelvis, retrohepatic space and right paracolic gutter. I washed out the left side as well.  Hemostasis is good. There was no perforation or injury.  Because the area cleaned up well after irrigation, I did not place a drain.   I aspirated the carbon dioxide. I removed the ports.  He was then therefore I could close the left lower quadrant stapler port site with 0  Vicryl interrupted suture I closed skin using 4-0 monocryl stitch.  Sterile dressings applied.  Patient was extubated and sent to the recovery room.  I suspect the patient is going used in the hospital at least overnight and will need antibiotics for 5 days.  I discussed operative findings, updated the patient's status, discussed probable steps to recovery, and gave postoperative recommendations to the patient's significant other, Molli Knock. Recommendations were made.  Questions were answered.  She expressed understanding & appreciation. Adin Hector, M.D., F.A.C.S. Gastrointestinal and Minimally Invasive Surgery [facility] [address] [place], [place] [address] [phone] Main / Paging  12/23/2019 8:39 PM

## 2019-12-23 NOTE — Anesthesia Procedure Notes (Signed)
Procedure Name: Intubation Date/Time: 12/23/2019 7:50 PM Performed by: Elyn Peers, CRNA Pre-anesthesia Checklist: Patient identified, Emergency Drugs available, Suction available, Patient being monitored and Timeout performed Patient Re-evaluated:Patient Re-evaluated prior to induction Oxygen Delivery Method: Circle system utilized Preoxygenation: Pre-oxygenation with 100% oxygen Induction Type: IV induction, Rapid sequence and Cricoid Pressure applied Laryngoscope Size: Miller and 3 Grade View: Grade I Tube type: Oral Tube size: 7.5 mm Number of attempts: 1 Airway Equipment and Method: Stylet Placement Confirmation: ETT inserted through vocal cords under direct vision,  positive ETCO2 and breath sounds checked- equal and bilateral Secured at: 23 cm Tube secured with: Tape Dental Injury: Teeth and Oropharynx as per pre-operative assessment

## 2019-12-23 NOTE — ED Provider Notes (Signed)
[facility] DEPT Provider Note   CSN: [id number] Arrival date & time: 12/23/19  1307     History Chief Complaint  Patient presents with  . Abdominal Pain    Brad Walters is a 34 y.o. male.  34 year old male presents with complaint of abdominal pain since yesterday associated nausea vomiting.  Patient states pain starts in his right lower quadrant and radiates around his abdomen, sharp in nature, worse with sitting up, walking, and movement, improved somewhat with lying supine.  Last bowel movement was 2 days ago, denies changes in bladder habits, fevers, chills.  No prior abdominal surgeries.  No other complaints or concerns.        No past medical history on file.  There are no problems to display for this patient.   No family history on file.  Social History   Tobacco Use  . Smoking status: Not on file  Substance Use Topics  . Alcohol use: Not on file  . Drug use: Not on file    Home Medications Prior to Admission medications   Medication Sig Start Date End Date Taking? Authorizing Provider  amphetamine-dextroamphetamine (ADDERALL) 20 MG tablet Take 20 mg by mouth daily.   Yes [provider]    Allergies    Other  Review of Systems   Review of Systems  Constitutional: Negative for fever.  Respiratory: Negative for shortness of breath.   Cardiovascular: Negative for chest pain.  Gastrointestinal: Positive for abdominal pain, constipation, nausea and vomiting. Negative for blood in stool and diarrhea.  Genitourinary: Negative for dysuria, frequency and hematuria.  Musculoskeletal: Negative for back pain.  Skin: Negative for rash and wound.  Allergic/Immunologic: Negative for immunocompromised state.  Neurological: Negative for weakness.  Hematological: Negative for adenopathy.  All other systems reviewed and are negative.   Physical Exam Updated Vital Signs BP 117/78 (BP Location: Right Arm)   Pulse 78    Temp 98 F (36.7 C) (Oral)   Resp 15   Ht 5\' 8"  (1.727 m)   Wt 65.8 kg   SpO2 100%   BMI 22.05 kg/m   Physical Exam Vitals and nursing note reviewed.  Constitutional:      General: He is not in acute distress.    Appearance: He is well-developed. He is not diaphoretic.  HENT:     Head: Normocephalic and atraumatic.  Cardiovascular:     Rate and Rhythm: Normal rate and regular rhythm.     Heart sounds: Normal heart sounds.  Pulmonary:     Effort: Pulmonary effort is normal.     Breath sounds: Normal breath sounds.  Abdominal:     General: Abdomen is flat.     Palpations: Abdomen is soft.     Tenderness: There is generalized abdominal tenderness and tenderness in the right lower quadrant. There is guarding and rebound. There is no right CVA tenderness or left CVA tenderness.  Skin:    General: Skin is warm and dry.     Findings: No erythema or rash.  Neurological:     Mental Status: He is alert and oriented to person, place, and time.  Psychiatric:        Behavior: Behavior normal.     ED Results / Procedures / Treatments   Labs (all labs ordered are listed, but only abnormal results are displayed) Labs Reviewed  COMPREHENSIVE METABOLIC PANEL - Abnormal; Notable for the following components:      Result Value   Potassium 3.1 (*)  Chloride 97 (*)    Glucose, Bld 113 (*)    Total Bilirubin 1.3 (*)    All other components within normal limits  CBC - Abnormal; Notable for the following components:   WBC 15.1 (*)    All other components within normal limits  URINALYSIS, ROUTINE W REFLEX MICROSCOPIC - Abnormal; Notable for the following components:   Hgb urine dipstick SMALL (*)    All other components within normal limits  RESPIRATORY PANEL BY RT PCR (FLU A&B, COVID)  LIPASE, BLOOD  DIFFERENTIAL    EKG None  Radiology CT Abdomen Pelvis W Contrast  Result Date: 12/23/2019 CLINICAL DATA:  Right lower quadrant pain the past 2 days with nausea and vomiting.  Suspect acute appendicitis. EXAM: CT ABDOMEN AND PELVIS WITH CONTRAST TECHNIQUE: Multidetector CT imaging of the abdomen and pelvis was performed using the standard protocol following bolus administration of intravenous contrast. Automatic exposure control utilized. CONTRAST:  OMNIPAQUE IOHEXOL 300 MG/ML  SOLN COMPARISON:  None. FINDINGS: Lower chest: Normal. Hepatobiliary: Normal. Pancreas: Normal. Spleen: Normally sized spleen. Benign splenic cleft anatomy superiorly. Adrenals/Urinary Tract: Normal. Stomach/Bowel: Abnormally dilated fluid-filled infra-cecal appendix measuring 13 mm diameter with wall thickening and a couple proximal appendicoliths that may be obstructing. Small amount of adjacent 5 Hounsfield unit free fluid in the right lower paracolic gutter. No free intraperitoneal air or abscess. Nonobstructed bowel. Moderate stool burden. No apparent gastric or enteric or colonic mucosal thickening. Vascular/Lymphatic: Normal caliber of the abdominal aorta. Small reactive appearing lymph nodes in the right lower abdominal quadrant. Reproductive: Normal. Other: Intact abdominal wall. Musculoskeletal: Normal. I discussed the critical Value/emergent results and recommended emergent surgical consultation by telephone at the time of interpretation on 12/23/2019 at 4:57 pm with provider Army Melia , who verbally acknowledged these results. IMPRESSION: Acute infra-cecal appendicitis with appendicoliths that may be obstructing, but no perforation or abscess or bowel obstruction. Electronically Signed   By: Laurence Ferrari   On: 12/23/2019 17:03    Procedures Procedures (including critical care time)  Medications Ordered in ED Medications  morphine 4 MG/ML injection 4 mg (4 mg Intravenous Given 12/23/19 1531)  sodium chloride (PF) 0.9 % injection (has no administration in time range)  cefTRIAXone (ROCEPHIN) 2 g in sodium chloride 0.9 % 100 mL IVPB (has no administration in time range)    And    metroNIDAZOLE (FLAGYL) IVPB 500 mg (has no administration in time range)  sodium chloride flush (NS) 0.9 % injection 3 mL (3 mLs Intravenous Given 12/23/19 1537)  ondansetron (ZOFRAN) injection 4 mg (4 mg Intravenous Given 12/23/19 1531)  iohexol (OMNIPAQUE) 300 MG/ML solution 100 mL (100 mLs Intravenous Contrast Given 12/23/19 1628)    ED Course  I have reviewed the triage vital signs and the nursing notes.  Pertinent labs & imaging results that were available during my care of the patient were reviewed by me and considered in my medical decision making (see chart for details).  Clinical Course as of Dec 22 1728  Thu Dec 23, 2019  463 34 year old male presents with complaint of right lower quadrant abdominal pain onset yesterday, radiates to generalized abdomen.  No reports of fever.  On exam, patient appears uncomfortable, has generalized abdominal tenderness, worse in the right lower quadrant with rebound and guarding.  No prior abdominal surgeries, exam concerning for acute appendicitis.  Labs returned with white count of 15,000, CMP with mild hypokalemia with potassium of 3.1.  Lipase is normal at 22.  Patient was given morphine  and Zofran.  CT abdomen pelvis with contrast concerning for acute appendicitis without perforation or abscess. Discussed results with patient, general surgery paged for consult.  Patient has been n.p.o. x24 hours.   [LM]  1730 Case discussed with Dr. Michaell Cowing on-call with general surgery who will consult.   [LM]    Clinical Course User Index [LM] Alden Hipp   MDM Rules/Calculators/A&P                      Final Clinical Impression(s) / ED Diagnoses Final diagnoses:  Acute appendicitis with localized peritonitis, without perforation, abscess, or gangrene    Rx / DC Orders ED Discharge Orders    None       Jeannie Fend, PA-C 12/23/19 1730    Mancel Bale, MD 12/25/19 1022

## 2019-12-23 NOTE — Anesthesia Postprocedure Evaluation (Signed)
Anesthesia Post Note  Patient: Brad Walters  Procedure(s) Performed: APPENDECTOMY LAPAROSCOPIC (N/A Abdomen)     Patient location during evaluation: PACU Anesthesia Type: General Level of consciousness: awake and alert Pain management: pain level controlled Vital Signs Assessment: post-procedure vital signs reviewed and stable Respiratory status: spontaneous breathing, nonlabored ventilation, respiratory function stable and patient connected to nasal cannula oxygen Cardiovascular status: blood pressure returned to baseline and stable Postop Assessment: no apparent nausea or vomiting Anesthetic complications: no    Last Vitals:  Vitals:   12/23/19 2045 12/23/19 2100  BP: 107/70 110/73  Pulse: 65 69  Resp: 12 14  Temp: 36.9 C   SpO2: 100% 100%    Last Pain:  Vitals:   12/23/19 2100  TempSrc:   PainSc: 0-No pain                 Robina Hamor S

## 2019-12-23 NOTE — Anesthesia Preprocedure Evaluation (Deleted)
Anesthesia Evaluation Anesthesia Physical Anesthesia Plan Anesthesia Quick Evaluation  

## 2019-12-23 NOTE — H&P (Signed)
Brad Walters  [birth year] [id number]  CARE TEAM:  PCP: Patient, No Pcp Per  Outpatient Care Team: Patient Care Team: Patient, No Pcp Per as PCP - General (General Practice)  Inpatient Treatment Team: Treatment Team: Attending Provider: Daleen Bo, MD; Registered Nurse: Kathlen Mody, RN; Physician Assistant: Roque Lias; Consulting Physician: Edison Pace, Md, MD   This patient is a 34 y.o.male who presents today for surgical evaluation at the request of Brad Walters, [facility] ED.   Chief complaint / Reason for evaluation: Abdominal pain probable appendicitis  Healthy active male began to feel decreased appetite and crampy abdominal pain yesterday.  Intensified and worsened today.  Became more focal in the lower side.  Came to emergency room.  Examination and CT scan suspicious for appendicitis.  Surgical consultation requested.  Patient is otherwise rather active.  No prior surgeries.  Does not smoke.  No history of inflammatory bowel disease.  Usually moves his bowels every day.  Can walk 1/2-hour without difficulty.  No personal nor family history of GI/colon cancer, inflammatory bowel disease, irritable bowel syndrome, allergy such as Celiac Sprue, dietary/dairy problems, colitis, ulcers nor gastritis.  No recent sick contacts/gastroenteritis.  No travel outside the country.  No changes in diet.  No dysphagia to solids or liquids.  No significant heartburn or reflux.  No hematochezia, hematemesis, coffee ground emesis.  No evidence of prior gastric/peptic ulceration.    Assessment  Brad Walters  34 y.o. male       Problem List:  Active Problems:   Acute appendicitis   History physical CT scan very suspicious for early acute appendicitis without obvious abscess, consistent with his 24-hour only symptom  Plan:  IV antibiotics.  IV fluids.  Diagnostic laparoscopy with appendectomy.  Try and get it done tonight.  The anatomy & physiology of  the digestive tract was discussed.  The pathophysiology of appendicitis and other appendiceal disorders were discussed.  Natural history risks without surgery was discussed.   I feel the risks of no intervention will lead to serious problems that outweigh the operative risks; therefore, I recommended diagnostic laparoscopy with removal of appendix to remove the pathology.  Laparoscopic & open techniques were discussed.   I noted a good likelihood this will help address the problem.   Risks such as bleeding, infection, abscess, leak, reoperation, injury to other organs, need for repair of tissues / organs, possible ostomy, hernia, heart attack, stroke, death, and other risks were discussed.  Goals of post-operative recovery were discussed as well.  We will work to minimize complications.  Questions were answered.  The patient expresses understanding & wishes to proceed with surgery.   -VTE prophylaxis- SCDs, etc -mobilize as tolerated to help recovery  35 minutes spent in review, evaluation, examination, counseling, and coordination of care.  More than 50% of that time was spent in counseling.  @SCGSIGNATURE @  12/23/2019      History reviewed. No pertinent past medical history.  History reviewed. No pertinent surgical history.  Social History   Socioeconomic History  . Marital status: Single    Spouse name: Not on file  . Number of children: Not on file  . Years of education: Not on file  . Highest education level: Not on file  Occupational History  . Not on file  Tobacco Use  . Smoking status: Never Smoker  . Smokeless tobacco: Never Used  Substance and Sexual Activity  . Alcohol use: Not Currently  . Drug  use: Not Currently  . Sexual activity: Yes  Other Topics Concern  . Not on file  Social History Narrative  . Not on file   Social Determinants of Health   Financial Resource Strain:   . Difficulty of Paying Living Expenses:   Food Insecurity:   . Worried About Community education officer in the Last Year:   . Barista in the Last Year:   Transportation Needs:   . Freight forwarder (Medical):   Marland Kitchen Lack of Transportation (Non-Medical):   Physical Activity:   . Days of Exercise per Week:   . Minutes of Exercise per Session:   Stress:   . Feeling of Stress :   Social Connections:   . Frequency of Communication with Friends and Family:   . Frequency of Social Gatherings with Friends and Family:   . Attends Religious Services:   . Active Member of Clubs or Organizations:   . Attends Banker Meetings:   Marland Kitchen Marital Status:   Intimate Partner Violence:   . Fear of Current or Ex-Partner:   . Emotionally Abused:   Marland Kitchen Physically Abused:   . Sexually Abused:     History reviewed. No pertinent family history.  Current Facility-Administered Medications  Medication Dose Route Frequency Provider Last Rate Last Admin  . [START ON 12/24/2019] acetaminophen (TYLENOL) tablet 1,000 mg  1,000 mg Oral On Call to OR Karie Soda, MD      . bupivacaine liposome (EXPAREL) 1.3 % injection 266 mg  20 mL Infiltration Once Karie Soda, MD      . Melene Muller ON 12/24/2019] cefTRIAXone (ROCEPHIN) 2 g in sodium chloride 0.9 % 100 mL IVPB  2 g Intravenous On Call to OR Karie Soda, MD       And  . metroNIDAZOLE (FLAGYL) IVPB 500 mg  500 mg Intravenous Trixie Deis, MD      . Melene Muller ON 12/24/2019] celecoxib (CELEBREX) capsule 200 mg  200 mg Oral On Call to OR Karie Soda, MD      . Chlorhexidine Gluconate Cloth 2 % PADS 6 each  6 each Topical Once Karie Soda, MD       And  . Chlorhexidine Gluconate Cloth 2 % PADS 6 each  6 each Topical Once Karie Soda, MD      . Melene Muller ON 12/24/2019] gabapentin (NEURONTIN) capsule 300 mg  300 mg Oral On Call to OR Karie Soda, MD      . metroNIDAZOLE (FLAGYL) IVPB 500 mg  500 mg Intravenous Once Army Melia A, PA-C      . morphine 4 MG/ML injection 4 mg  4 mg Intravenous Q1H PRN Army Melia A, PA-C   4 mg at  12/23/19 1531  . sodium chloride (PF) 0.9 % injection            Current Outpatient Medications  Medication Sig Dispense Refill  . amphetamine-dextroamphetamine (ADDERALL) 20 MG tablet Take 20 mg by mouth daily.       Allergies  Allergen Reactions  . Other     Pt states as a child he took a pain medicine and it made him sick. Cannot remember what med or class it was.     ROS:   All other systems reviewed & are negative except per HPI or as noted below: Constitutional:  No fevers, chills, sweats.  Weight stable Eyes:  No vision changes, No discharge HENT:  No sore throats, nasal drainage Lymph: No neck swelling, No  bruising easily Pulmonary:  No cough, productive sputum CV: No orthopnea, PND  Patient walks 60 minutes for about 2 miles without difficulty.  No exertional chest/neck/shoulder/arm pain. GI: No personal nor family history of GI/colon cancer, inflammatory bowel disease, irritable bowel syndrome, allergy such as Celiac Sprue, dietary/dairy problems, colitis, ulcers nor gastritis.  No recent sick contacts/gastroenteritis.  No travel outside the country.  No changes in diet. Renal: No UTIs, No hematuria Genital:  No drainage, bleeding, masses Musculoskeletal: No severe joint pain.  Good ROM major joints Skin:  No sores or lesions.  No rashes Heme/Lymph:  No easy bleeding.  No swollen lymph nodes Neuro: No focal weakness/numbness.  No seizures Psych: No suicidal ideation.  No hallucinations  BP 111/84   Pulse 70   Temp 98 F (36.7 C) (Oral)   Resp 18   Ht 5\' 8"  (1.727 m)   Wt 65.8 kg   SpO2 99%   BMI 22.05 kg/m   Physical Exam: General: Pt awake/alert in moderate major acute distress.  Obviously uncomfortable and lying still, afraid to move. Eyes: PERRL, normal EOM. Sclera nonicteric Neuro: CN II-XII intact w/o focal sensory/motor deficits. Lymph: No head/neck/groin lymphadenopathy Psych:  No delerium/psychosis/paranoia.  Oriented x4 HENT: Normocephalic, Mucus  membranes moist.  No thrush.   Neck: Supple, No tracheal deviation.  No obvious thyromegaly Chest: No pain to chest wall compression.  Good respiratory excursion.  No audible wheezing CV:  Pulses intact.  Regular rhythm.  No major extremity edema Abdomen: Soft, Nondistended.  Obvious pain in right lower quadrant with some involuntary guarding.  Reproduction of pain with cough in bed shake very suspicious for focal peritonitis.  Upper abdomen mildly distended but soft and optically tender.  No diastases recti.  No umbilical hernia.   Gen:  No inguinal hernias.  No inguinal lymphadenopathy.   Ext: No obvious deformity or contracture no significant edema.  No cyanosis Skin: No petechiae / purpurea.  No major sores.  Warm and dry Musculoskeletal: No severe joint pain.  Good ROM major joints   Results:   Labs: Results for orders placed or performed during the hospital encounter of 12/23/19 (from the past 48 hour(s))  Lipase, blood     Status: None   Collection Time: 12/23/19  2:28 PM  Result Value Ref Range   Lipase 22 11 - 51 U/L    Comment: Performed at [facility], [address] [place], [place] [id number]  Comprehensive metabolic panel     Status: Abnormal   Collection Time: 12/23/19  2:28 PM  Result Value Ref Range   Sodium 137 135 - 145 mmol/L   Potassium 3.1 (L) 3.5 - 5.1 mmol/L   Chloride 97 (L) 98 - 111 mmol/L   CO2 31 22 - 32 mmol/L   Glucose, Bld 113 (H) 70 - 99 mg/dL    Comment: Glucose reference range applies only to samples taken after fasting for at least 8 hours.   BUN 6 6 - 20 mg/dL   Creatinine, Ser 6.041.20 0.61 - 1.24 mg/dL   Calcium 9.2 8.9 - 54.010.3 mg/dL   Total Protein 7.5 6.5 - 8.1 g/dL   Albumin 4.3 3.5 - 5.0 g/dL   AST 17 15 - 41 U/L   ALT 21 0 - 44 U/L   Alkaline Phosphatase 46 38 - 126 U/L   Total Bilirubin 1.3 (H) 0.3 - 1.2 mg/dL   GFR calc non Af Amer >60 >60 mL/min   GFR calc Af Amer >  60 >60 mL/min   Anion gap 9 5 - 15    Comment:  Performed at [facility], [address] [place], [place] [address]  CBC     Status: Abnormal   Collection Time: 12/23/19  2:28 PM  Result Value Ref Range   WBC 15.1 (H) 4.0 - 10.5 K/uL   RBC 4.71 4.22 - 5.81 MIL/uL   Hemoglobin 14.7 13.0 - 17.0 g/dL   HCT 95.2 84.1 - 32.4 %   MCV 95.1 80.0 - 100.0 fL   MCH 31.2 26.0 - 34.0 pg   MCHC 32.8 30.0 - 36.0 g/dL   RDW 40.1 02.7 - 25.3 %   Platelets 210 150 - 400 K/uL   nRBC 0.0 0.0 - 0.2 %    Comment: Performed at [facility], [address] [place], [place] [address]  Urinalysis, Routine w reflex microscopic     Status: Abnormal   Collection Time: 12/23/19  4:31 PM  Result Value Ref Range   Color, Urine YELLOW YELLOW   APPearance CLEAR CLEAR   Specific Gravity, Urine 1.010 1.005 - 1.030   pH 6.0 5.0 - 8.0   Glucose, UA NEGATIVE NEGATIVE mg/dL   Hgb urine dipstick SMALL (A) NEGATIVE   Bilirubin Urine NEGATIVE NEGATIVE   Ketones, ur NEGATIVE NEGATIVE mg/dL   Protein, ur NEGATIVE NEGATIVE mg/dL   Nitrite NEGATIVE NEGATIVE   Leukocytes,Ua NEGATIVE NEGATIVE   RBC / HPF 0-5 0 - 5 RBC/hpf   WBC, UA 0-5 0 - 5 WBC/hpf   Bacteria, UA NONE SEEN NONE SEEN   Mucus PRESENT     Comment: Performed at [facility], [address] [place], [place] [address]    Imaging / Studies: CT Abdomen Pelvis W Contrast  Result Date: 12/23/2019 CLINICAL DATA:  Right lower quadrant pain the past 2 days with nausea and vomiting. Suspect acute appendicitis. EXAM: CT ABDOMEN AND PELVIS WITH CONTRAST TECHNIQUE: Multidetector CT imaging of the abdomen and pelvis was performed using the standard protocol following bolus administration of intravenous contrast. Automatic exposure control utilized. CONTRAST:  OMNIPAQUE IOHEXOL 300 MG/ML  SOLN COMPARISON:  None. FINDINGS: Lower chest: Normal. Hepatobiliary: Normal. Pancreas: Normal. Spleen: Normally sized spleen. Benign splenic cleft anatomy superiorly.  Adrenals/Urinary Tract: Normal. Stomach/Bowel: Abnormally dilated fluid-filled infra-cecal appendix measuring 13 mm diameter with wall thickening and a couple proximal appendicoliths that may be obstructing. Small amount of adjacent 5 Hounsfield unit free fluid in the right lower paracolic gutter. No free intraperitoneal air or abscess. Nonobstructed bowel. Moderate stool burden. No apparent gastric or enteric or colonic mucosal thickening. Vascular/Lymphatic: Normal caliber of the abdominal aorta. Small reactive appearing lymph nodes in the right lower abdominal quadrant. Reproductive: Normal. Other: Intact abdominal wall. Musculoskeletal: Normal. I discussed the critical Value/emergent results and recommended emergent surgical consultation by telephone at the time of interpretation on 12/23/2019 at 4:57 pm with provider Army Melia , who verbally acknowledged these results. IMPRESSION: Acute infra-cecal appendicitis with appendicoliths that may be obstructing, but no perforation or abscess or bowel obstruction. Electronically Signed   By: Laurence Ferrari   On: 12/23/2019 17:03    Medications / Allergies: per chart  Antibiotics: Anti-infectives (From admission, onward)   Start     Dose/Rate Route Frequency Ordered Stop   12/24/19 0600  cefTRIAXone (ROCEPHIN) 2 g in sodium chloride 0.9 % 100 mL IVPB     2 g 200 mL/hr over 30 Minutes Intravenous On call to O.R. 12/23/19 1813  12/25/19 0559   12/23/19 1815  metroNIDAZOLE (FLAGYL) IVPB 500 mg     500 mg 100 mL/hr over 60 Minutes Intravenous Every 8 hours 12/23/19 1813     12/23/19 1715  cefTRIAXone (ROCEPHIN) 2 g in sodium chloride 0.9 % 100 mL IVPB     2 g 200 mL/hr over 30 Minutes Intravenous  Once 12/23/19 1702 12/23/19 1806   12/23/19 1715  metroNIDAZOLE (FLAGYL) IVPB 500 mg     500 mg 100 mL/hr over 60 Minutes Intravenous  Once 12/23/19 1702          Note: Portions of this report may have been transcribed using voice recognition software.  Every effort was made to ensure accuracy; however, inadvertent computerized transcription errors may be present.   Any transcriptional errors that result from this process are unintentional.    @scgsignature @  12/23/2019

## 2019-12-23 NOTE — ED Triage Notes (Signed)
Pt c/o abd pain x2 days.  Reports working with mold killer that spilled in car 2 days ago. Reports n/v x2 days. Denies diarrhea.

## 2019-12-23 NOTE — ED Notes (Signed)
Surgeon at bedside.  

## 2019-12-24 MED ORDER — ACETAMINOPHEN 500 MG PO TABS: 1000.0000 mg | ORAL_TABLET | Freq: Three times a day (TID) | ORAL | 0 refills | Status: DC

## 2019-12-24 MED ORDER — OXYCODONE HCL 5 MG PO TABS: 5.0000 mg | ORAL_TABLET | ORAL | 0 refills | Status: DC | PRN

## 2019-12-24 MED ORDER — AMOXICILLIN-POT CLAVULANATE 875-125 MG PO TABS: 1.0000 | ORAL_TABLET | Freq: Two times a day (BID) | ORAL | 0 refills | Status: AC

## 2019-12-24 NOTE — Plan of Care (Signed)
  Problem: Education: Goal: Knowledge of General Education information will improve Description: Including pain rating scale, medication(s)/side effects and non-pharmacologic comfort measures 12/24/2019 0842 by Iantha Fallen, RN Outcome: Adequate for Discharge 12/24/2019 0837 by Iantha Fallen, RN Outcome: Progressing   Problem: Health Behavior/Discharge Planning: Goal: Ability to manage health-related needs will improve 12/24/2019 0842 by Iantha Fallen, RN Outcome: Adequate for Discharge 12/24/2019 0837 by Iantha Fallen, RN Outcome: Progressing   Problem: Clinical Measurements: Goal: Ability to maintain clinical measurements within normal limits will improve 12/24/2019 0842 by Iantha Fallen, RN Outcome: Adequate for Discharge 12/24/2019 0837 by Iantha Fallen, RN Outcome: Progressing Goal: Will remain free from infection 12/24/2019 0842 by Iantha Fallen, RN Outcome: Adequate for Discharge 12/24/2019 0837 by Iantha Fallen, RN Outcome: Progressing Goal: Diagnostic test results will improve 12/24/2019 0842 by Iantha Fallen, RN Outcome: Adequate for Discharge 12/24/2019 0837 by Iantha Fallen, RN Outcome: Progressing Goal: Respiratory complications will improve 12/24/2019 0842 by Iantha Fallen, RN Outcome: Adequate for Discharge 12/24/2019 0837 by Iantha Fallen, RN Outcome: Progressing Goal: Cardiovascular complication will be avoided 12/24/2019 0842 by Iantha Fallen, RN Outcome: Adequate for Discharge 12/24/2019 0837 by Iantha Fallen, RN Outcome: Progressing   Problem: Activity: Goal: Risk for activity intolerance will decrease 12/24/2019 0842 by Iantha Fallen, RN Outcome: Adequate for Discharge 12/24/2019 0837 by Iantha Fallen, RN Outcome: Progressing   Problem: Nutrition: Goal: Adequate nutrition will be maintained 12/24/2019 0842 by Iantha Fallen, RN Outcome: Adequate for Discharge 12/24/2019 0837 by Iantha Fallen, RN Outcome: Progressing    Problem: Coping: Goal: Level of anxiety will decrease 12/24/2019 0842 by Iantha Fallen, RN Outcome: Adequate for Discharge 12/24/2019 0837 by Iantha Fallen, RN Outcome: Progressing   Problem: Elimination: Goal: Will not experience complications related to bowel motility Outcome: Adequate for Discharge Goal: Will not experience complications related to urinary retention Outcome: Adequate for Discharge   Problem: Pain Managment: Goal: General experience of comfort will improve Outcome: Adequate for Discharge   Problem: Safety: Goal: Ability to remain free from injury will improve Outcome: Adequate for Discharge   Problem: Skin Integrity: Goal: Risk for impaired skin integrity will decrease Outcome: Adequate for Discharge

## 2019-12-24 NOTE — Discharge Summary (Signed)
    Patient ID: Brad Walters [id number] 1986-03-16 34 y.o.  Admit date: 12/23/2019 Discharge date: 12/24/2019  Admitting Diagnosis: Acute appendicitis  Discharge Diagnosis Patient Active Problem List   Diagnosis Date Noted  . Acute perforated appendicitis with phlegmon s/p lap appendectomy 12/23/2019 12/23/2019    Consultants none  Reason for Admission: Healthy active male began to feel decreased appetite and crampy abdominal pain yesterday.  Intensified and worsened today.  Became more focal in the lower side.  Came to emergency room.  Examination and CT scan suspicious for appendicitis.  Surgical consultation requested.  Patient is otherwise rather active.  No prior surgeries.  Does not smoke.  No history of inflammatory bowel disease.  Usually moves his bowels every day.  Can walk 1/2-hour without difficulty.  No personal nor family history of GI/colon cancer, inflammatory bowel disease, irritable bowel syndrome, allergy such as Celiac Sprue, dietary/dairy problems, colitis, ulcers nor gastritis.  No recent sick contacts/gastroenteritis.  No travel outside the country.  No changes in diet.  No dysphagia to solids or liquids.  No significant heartburn or reflux.  No hematochezia, hematemesis, coffee ground emesis.  No evidence of prior gastric/peptic ulceration.  Procedures Lap appy, Dr. Michaell Cowing 12/23/19  Hospital Course:  The patient was admitted and underwent a laparoscopic appendectomy for perforated appendicitis with phlegmon.  The patient tolerated the procedure well.  On POD 1, the patient was tolerating a diet, voiding well, mobilizing, and pain was controlled with oral pain medications.  The patient was stable for DC home at this time with appropriate follow up made.   Physical Exam: Abd: soft, appropriately tender, +BS, ND, incisions c/d/i  Allergies as of 12/24/2019      Reactions   Other    Pt states as a child he took a pain medicine and it made him sick. Cannot  remember what med or class it was.       Medication List    TAKE these medications   acetaminophen 500 MG tablet Commonly known as: TYLENOL Take 2 tablets (1,000 mg total) by mouth every 8 (eight) hours.   amoxicillin-clavulanate 875-125 MG tablet Commonly known as: Augmentin Take 1 tablet by mouth 2 (two) times daily for 4 days.   amphetamine-dextroamphetamine 20 MG tablet Commonly known as: ADDERALL Take 20 mg by mouth daily.   oxyCODONE 5 MG immediate release tablet Commonly known as: Oxy IR/ROXICODONE Take 1 tablet (5 mg total) by mouth every 4 (four) hours as needed for moderate pain (Use if taking PO well).        Follow-up Information    [facility], [facility] Follow up on 01/04/2020.   Specialty: General Surgery Why: 10:15am, arrive by 9:45am for paperwork and check in process.  Please bring photo ID with you Contact information: [address] [place] [address] [phone]           Signed: Barnetta Chapel, [facility] 12/24/2019, 8:53 AM Please see Amion for pager number during day hours 7:00am-4:30pm, 7-11:30am on Weekends

## 2019-12-24 NOTE — Plan of Care (Signed)

## 2019-12-27 LAB — SURGICAL PATHOLOGY

## 2021-04-17 ENCOUNTER — Other Ambulatory Visit: Payer: Self-pay

## 2021-04-17 ENCOUNTER — Encounter (HOSPITAL_COMMUNITY): Payer: Self-pay | Admitting: Emergency Medicine

## 2021-04-17 ENCOUNTER — Ambulatory Visit (HOSPITAL_COMMUNITY)
Admission: EM | Admit: 2021-04-17 | Discharge: 2021-04-17 | Disposition: A | Discharge status: Home / Self Care | Payer: Self-pay | Attending: Urgent Care | Admitting: Urgent Care

## 2021-04-17 DIAGNOSIS — L02214 Cutaneous abscess of groin: Secondary | ICD-10-CM

## 2021-04-17 MED ORDER — CLINDAMYCIN HCL 300 MG PO CAPS: 300.0000 mg | ORAL_CAPSULE | Freq: Three times a day (TID) | ORAL | 0 refills | Status: AC

## 2021-04-17 NOTE — ED Triage Notes (Signed)
Ant thought he had an ingrown hair 2 weeks ago.  Patient did go to a facility and received antibiotics and did finish them.  Patient report site did improve, but not gone

## 2021-04-17 NOTE — ED Provider Notes (Signed)
  [facility]   MRN: [id number] DOB: Mar 12, 1986  Subjective:   Brad Walters is a 35 y.o. male presenting for 2-week history of persistent swelling and pain over the left groin area.  Patient has had an office visit for this already, was prescribed doxycycline and then Keflex without any relief of his symptoms.  He has not had an incision and drainage.  Denies fever, nausea, vomiting, frank drainage of pus or bleeding.  No current facility-administered medications for this encounter. No current outpatient medications on file.   No Known Allergies  History reviewed. No pertinent past medical history.   History reviewed. No pertinent surgical history.  Family History  Problem Relation Age of Onset   Healthy Mother     Social History   Tobacco Use   Smoking status: Every Day    Pack years: 0.00    Types: Cigarettes   Smokeless tobacco: Never  Vaping Use   Vaping Use: Never used  Substance Use Topics   Alcohol use: Never   Drug use: Never    ROS   Objective:   Vitals: BP 112/71 (BP Location: Right Arm)   Pulse 72   Temp 98.8 F (37.1 C) (Oral)   Resp 18   SpO2 95%   Physical Exam Constitutional:      General: He is not in acute distress.    Appearance: Normal appearance. He is well-developed and normal weight. He is not ill-appearing, toxic-appearing or diaphoretic.  HENT:     Head: Normocephalic and atraumatic.     Right Ear: External ear normal.     Left Ear: External ear normal.     Nose: Nose normal.     Mouth/Throat:     Pharynx: Oropharynx is clear.  Eyes:     General: No scleral icterus.       Right eye: No discharge.        Left eye: No discharge.     Extraocular Movements: Extraocular movements intact.     Pupils: Pupils are equal, round, and reactive to light.  Cardiovascular:     Rate and Rhythm: Normal rate.  Pulmonary:     Effort: Pulmonary effort is normal.  Genitourinary:   Musculoskeletal:     Cervical back:  Normal range of motion.  Neurological:     Mental Status: He is alert and oriented to person, place, and time.  Psychiatric:        Mood and Affect: Mood normal.        Behavior: Behavior normal.        Thought Content: Thought content normal.        Judgment: Judgment normal.    PROCEDURE NOTE: I&D of Abscess Verbal consent obtained. Local anesthesia with 2cc of 1% lidocaine with epinephrine. Site cleansed with Betadine. Incision of 1/2cm was made using an 11 blade, 3cc expressed consisting of a mixture of pus and serosanguinous fluid. Wound cavity was explored with curved hemostats and loculations loosened. Cleansed and dressed.   Assessment and Plan :   PDMP not reviewed this encounter.  1. Abscess of groin, left     Successful I&D performed.  Wound care reviewed.  Start clindamycin for the abscess, Tylenol and/or ibuprofen for pain and inflammation. Counseled patient on potential for adverse effects with medications prescribed/recommended today, ER and return-to-clinic precautions discussed, patient verbalized understanding.      Wallis Bamberg, [place] 04/17/21 1601

## 2021-04-17 NOTE — Discharge Instructions (Addendum)
Please change your dressing 3-5 times daily. Do not apply any ointments or creams. Each time you change your dressing, make sure that you are pressing on the wound to get pus to come out.  Try your best to have a family member help you clean gently around the perimeter of the wound with gentle soap and warm water. Pat your wound dry and let it air out if possible to make sure it is dry before reapplying another dressing.   

## 2021-12-29 IMAGING — CT CT ABD-PELV W/ CM
2 of 4 series · 16 of 46 positions shown, 18 images · IV contrast (OMNIPAQUE)
Comparison: None.

CLINICAL DATA: Right lower quadrant pain the past 2 days with
nausea and vomiting. Suspect acute appendicitis.

EXAM:
CT ABDOMEN AND PELVIS WITH CONTRAST
TECHNIQUE: Multidetector CT imaging of the abdomen and pelvis was performed
using the standard protocol following bolus administration of
intravenous contrast. Automatic exposure control utilized.
CONTRAST:  100mL OMNIPAQUE IOHEXOL 300 MG/ML  SOLN

[Series 2: axial st · axial · 0.69mm/px · z∈[-695,-295]mm · 13 of 90 slices shown, 15 images]
[im 5/90  soft-tissue]
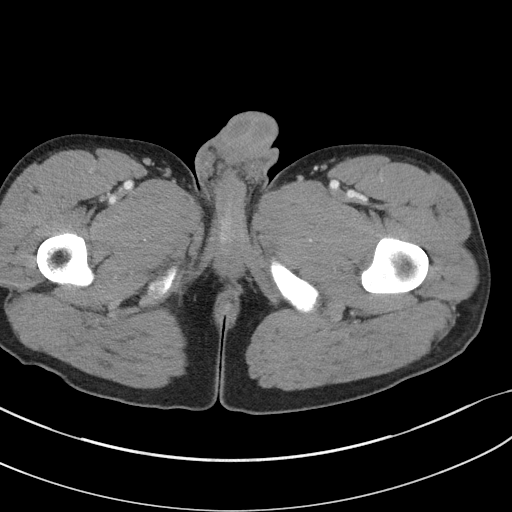
[im 5/90  bone]
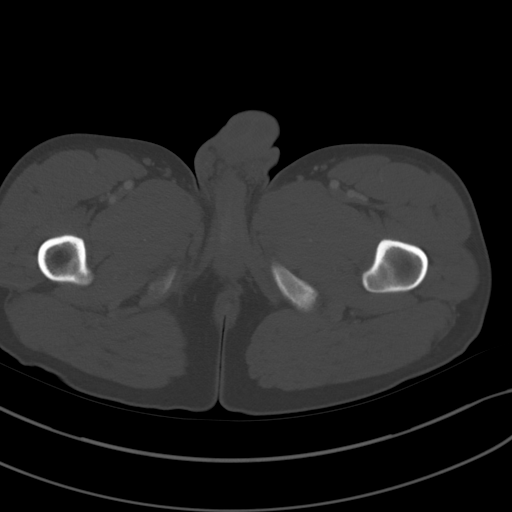
[im 14/90  soft-tissue]
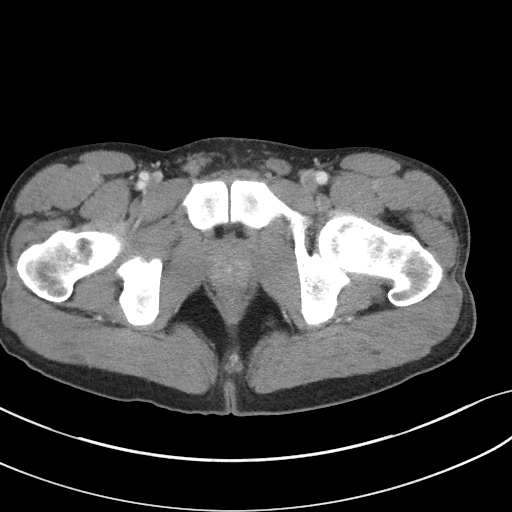
[im 18/90  soft-tissue]
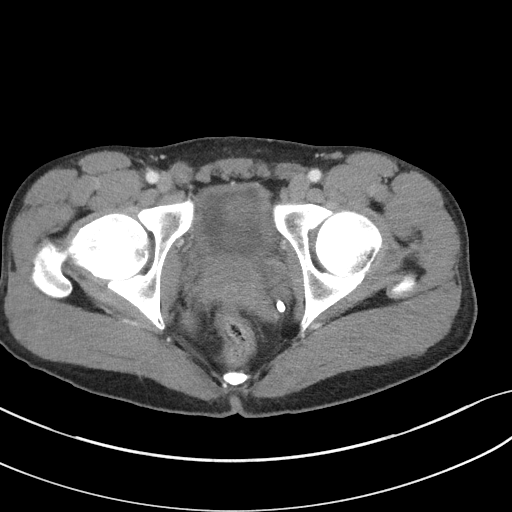
[im 27/90  soft-tissue]
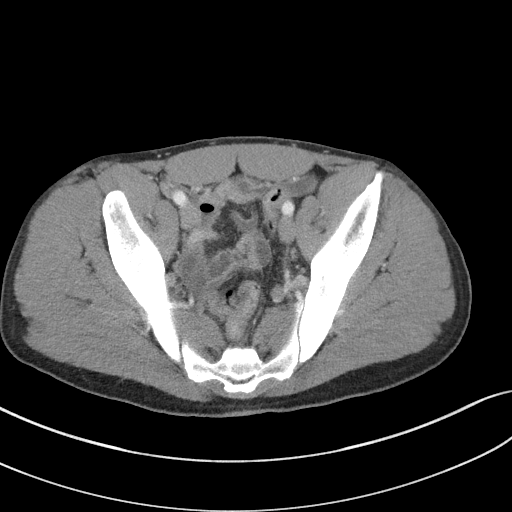
[im 32/90  soft-tissue]
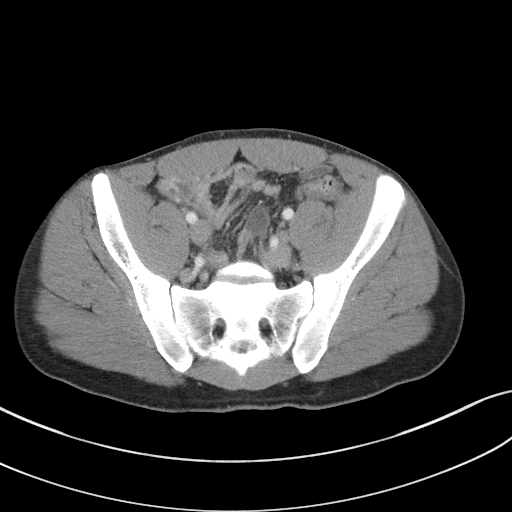
[im 41/90  soft-tissue]
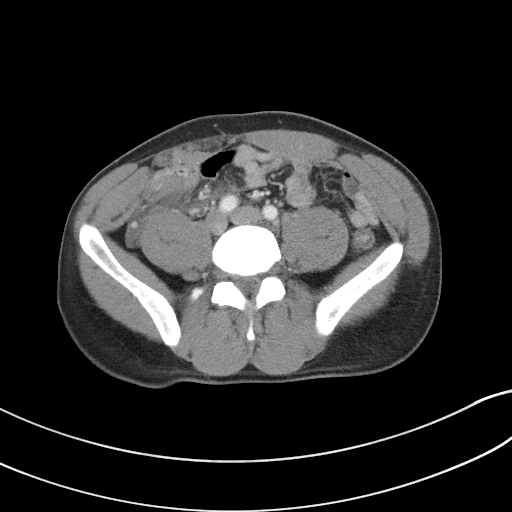
[im 45/90  soft-tissue]
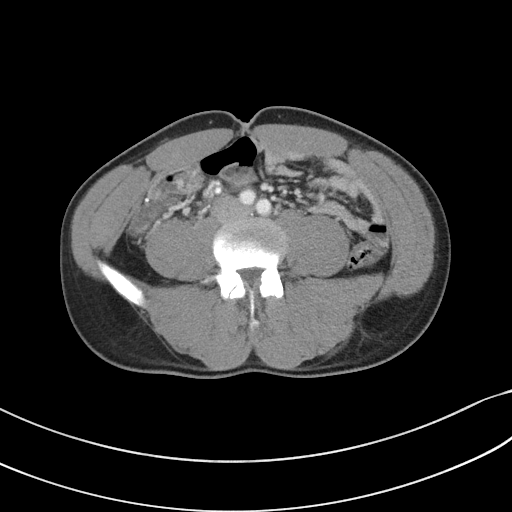
[im 49/90  soft-tissue]
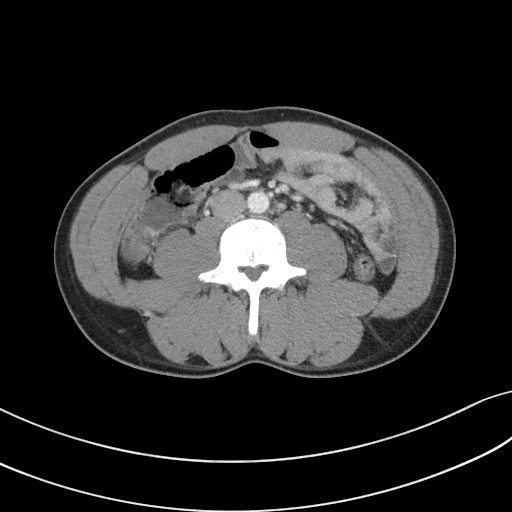
[im 58/90  soft-tissue]
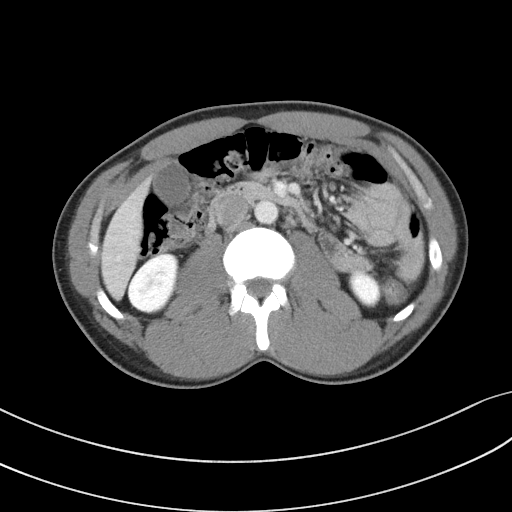
[im 58/90  bone]
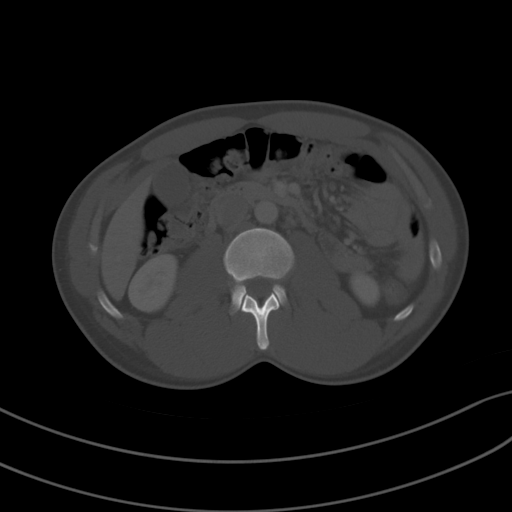
[im 63/90  soft-tissue]
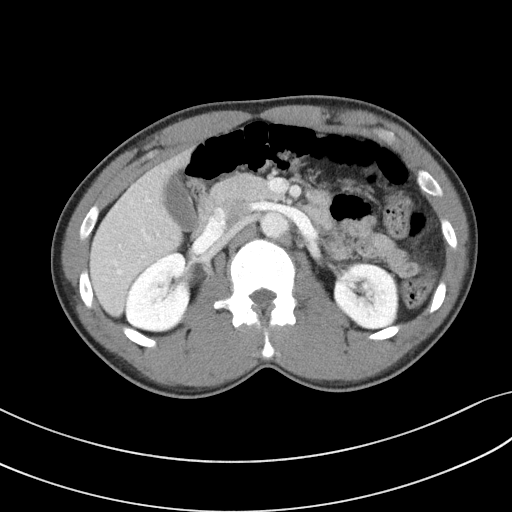
[im 72/90  soft-tissue]
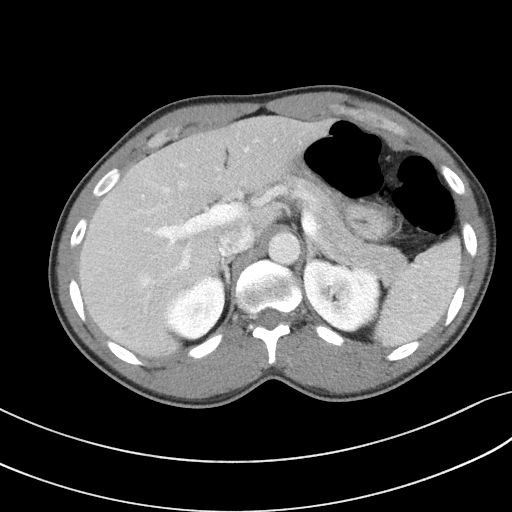
[im 76/90  soft-tissue]
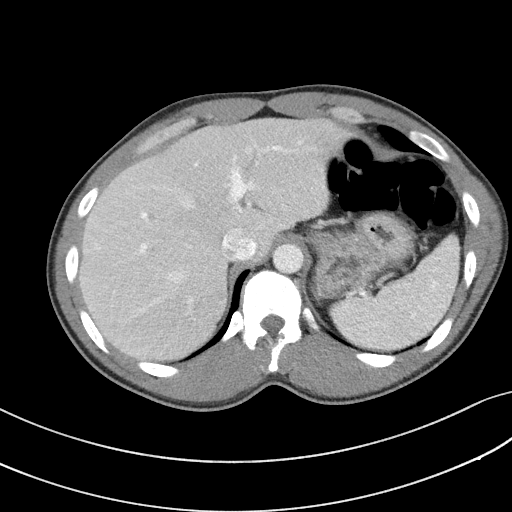
[im 85/90  soft-tissue]
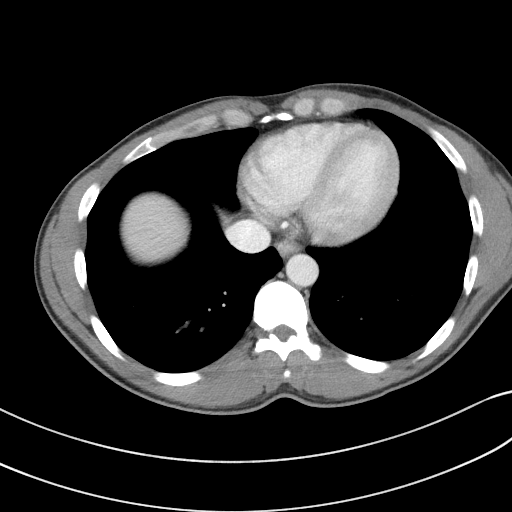

[Series 5: coronal st · coronal · 0.64mm/px · 3 of 77 slices shown]
[im 26/77  soft-tissue]
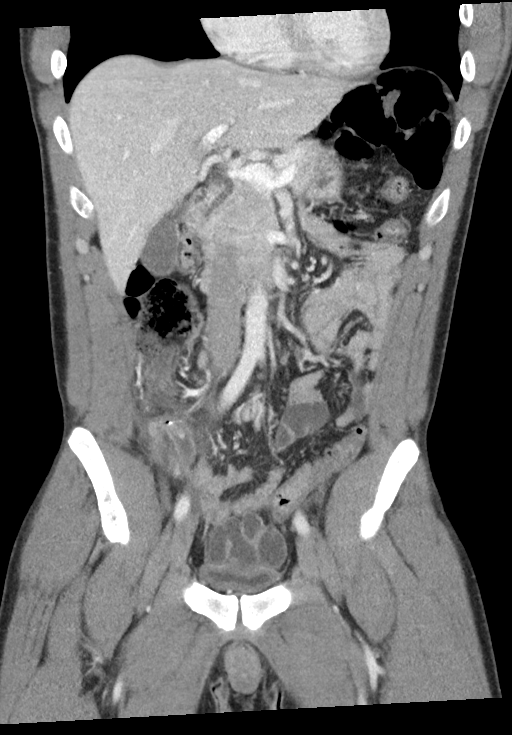
[im 34/77  soft-tissue]
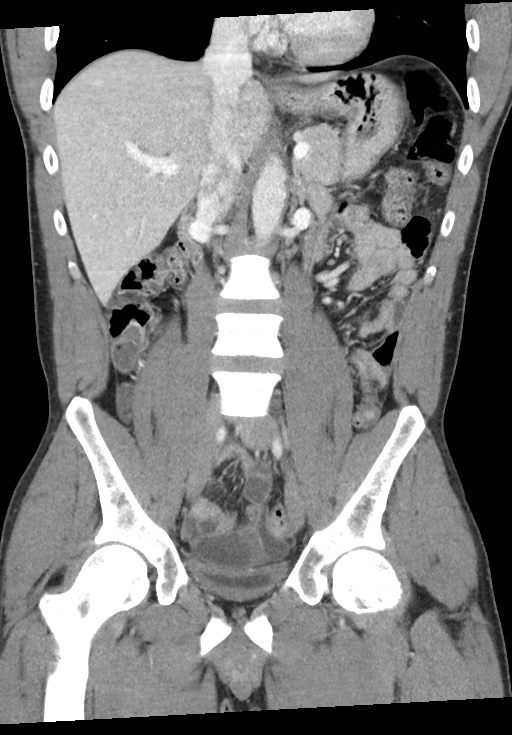
[im 43/77  soft-tissue]
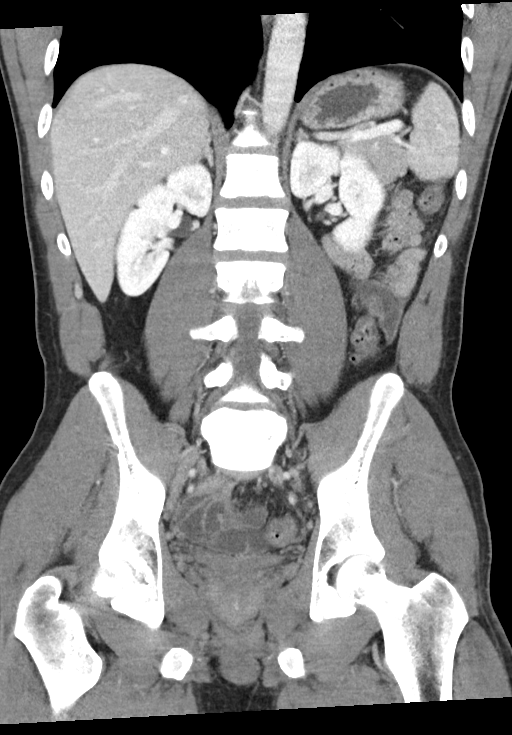

[16 of 46 positions shown; findings below may reference images not displayed]

FINDINGS: Lower chest: Normal.

Hepatobiliary: Normal.

Pancreas: Normal.

Spleen: Normally sized spleen. Benign splenic cleft anatomy
superiorly.

Adrenals/Urinary Tract: Normal.

Stomach/Bowel: Abnormally dilated fluid-filled infra-cecal appendix
measuring 13 mm diameter with wall thickening and a couple proximal
appendicoliths that may be obstructing. Small amount of adjacent 5
Hounsfield unit free fluid in the right lower paracolic gutter. No
free intraperitoneal air or abscess. Nonobstructed bowel. Moderate
stool burden. No apparent gastric or enteric or colonic mucosal
thickening.

Vascular/Lymphatic: Normal caliber of the abdominal aorta. Small
reactive appearing lymph nodes in the right lower abdominal
quadrant.

Reproductive: Normal.

Other: Intact abdominal wall.

Musculoskeletal: Normal.

I discussed the critical Value/emergent results and recommended
emergent surgical consultation by telephone at the time of
interpretation on 12/23/2019 at [DATE] with provider RTOYOTA JOSHJAX ,
who verbally acknowledged these results.
IMPRESSION: Acute infra-cecal appendicitis with appendicoliths that may be
obstructing, but no perforation or abscess or bowel obstruction.

## 2024-04-09 ENCOUNTER — Encounter (HOSPITAL_COMMUNITY): Payer: Self-pay

## 2024-04-09 ENCOUNTER — Emergency Department (HOSPITAL_COMMUNITY)
Admission: EM | Admit: 2024-04-09 | Discharge: 2024-04-09 | Disposition: A | Discharge status: Home / Self Care | Attending: Emergency Medicine | Admitting: Emergency Medicine

## 2024-04-09 ENCOUNTER — Other Ambulatory Visit: Payer: Self-pay

## 2024-04-09 ENCOUNTER — Emergency Department (HOSPITAL_COMMUNITY)

## 2024-04-09 DIAGNOSIS — X810XXA Intentional self-harm by jumping or lying in front of motor vehicle, initial encounter: Secondary | ICD-10-CM | POA: Insufficient documentation

## 2024-04-09 DIAGNOSIS — S99911A Unspecified injury of right ankle, initial encounter: Secondary | ICD-10-CM | POA: Diagnosis present

## 2024-04-09 DIAGNOSIS — Y9339 Activity, other involving climbing, rappelling and jumping off: Secondary | ICD-10-CM | POA: Insufficient documentation

## 2024-04-09 DIAGNOSIS — S93401A Sprain of unspecified ligament of right ankle, initial encounter: Secondary | ICD-10-CM | POA: Diagnosis not present

## 2024-04-09 NOTE — ED Triage Notes (Signed)
 Patient BIB law enforcement for right foot pain. Pain states he jumped off of a truck. Patient is yelling and uncooperative at triage.

## 2024-04-09 NOTE — Discharge Instructions (Signed)
 You were seen in the ER for an injury to your right foot and right The x-rays do not show any broken bones or dislocations This is most likely an ankle sprain For this reason we gave you an Aircast to go home with Keep this in place for extra support when walking Take Tylenol Motrin as directed for pain and inflammation Elevate the ankle and apply ice Follow-up with orthopedic specialist at the number listed above if you are unable to walk or your pain gets more severe Return to the emergency department for additional injuries or other concerns

## 2024-04-09 NOTE — ED Provider Notes (Signed)
 [place] EMERGENCY DEPARTMENT AT [facility] Provider Note   CSN: [id number] Arrival date & time: 04/09/24  9361     Patient presents with: Foot Pain   Brad Walters is a 38 y.o. male.  Who presents to ED for a right ankle injury.  Patient reports jumping off of a moving truck this morning.  He landed on both feet injuring his right ankle.  Now with right ankle pain.  Able to ambulate but with some discomfort.  He is under [facility] police custody after jumping out of a moving vehicle.  Denies other injuries at this time    Foot Pain       Prior to Admission medications   Medication Sig Start Date End Date Taking? Authorizing Provider  clindamycin  (CLEOCIN ) 300 MG capsule Take 1 capsule (300 mg total) by mouth 3 (three) times daily. 04/17/21   Christopher Savannah, PA-C    Allergies: Patient has no known allergies.    Review of Systems  Updated Vital Signs BP (!) 120/92 (BP Location: Right Arm)   Pulse 86   Temp 98.9 F (37.2 C) (Oral)   Resp 20   Ht 5' 8 (1.727 m)   Wt 65.8 kg   SpO2 100%   BMI 22.05 kg/m   Physical Exam Vitals and nursing note reviewed.  HENT:     Head: Normocephalic and atraumatic.   Eyes:     Pupils: Pupils are equal, round, and reactive to light.    Cardiovascular:     Rate and Rhythm: Normal rate and regular rhythm.  Pulmonary:     Effort: Pulmonary effort is normal.     Breath sounds: Normal breath sounds.  Abdominal:     Palpations: Abdomen is soft.     Tenderness: There is no abdominal tenderness.   Musculoskeletal:     Comments: Tenderness over right lateral malleolus Mild soft tissue edema over right lateral ankle 5 out of 5 motor strength bilateral feet and ankles Sensation tact light touch throughout lower extremities No anterior knee tenderness deformity No other bony tenderness   Skin:    General: Skin is warm and dry.   Neurological:     Mental Status: He is alert.   Psychiatric:        Mood and Affect:  Mood normal.     (all labs ordered are listed, but only abnormal results are displayed) Labs Reviewed - No data to display  EKG: None  Radiology: DG Ankle Complete Right Result Date: 04/09/2024 CLINICAL DATA:  Patient brought in by police with right foot pain after jumping off a truck. EXAM: RIGHT ANKLE - COMPLETE 3+ VIEW COMPARISON:  None Available. FINDINGS: There is no evidence of fracture, dislocation, or joint effusion. There is no evidence of arthropathy or other focal bone abnormality. Soft tissues are unremarkable. IMPRESSION: Negative. Electronically Signed   By: Waddell Calk M.D.   On: 04/09/2024 07:42   DG Foot Complete Right Result Date: 04/09/2024 CLINICAL DATA:  Patient jumped off of truck. Complains of right foot pain. EXAM: RIGHT FOOT COMPLETE - 3+ VIEW COMPARISON:  None Available. FINDINGS: There is no evidence of fracture or dislocation. There is no evidence of arthropathy or other focal bone abnormality. Soft tissues are unremarkable. IMPRESSION: Negative. Electronically Signed   By: Waddell Calk M.D.   On: 04/09/2024 07:41     Procedures   Medications Ordered in the ED - No data to display  Clinical Course as of 04/09/24 0751  Fri  Apr 09, 2024  0750 X-rays negative for fracture or dislocation.  Suspect ankle sprain.  Counseled patient on symptomatic management and will provide Aircast.  He will be discharged under police custody with instruction for orthopedic follow-up if needed [MP]    Clinical Course User Index [MP] Pamella Ozell LABOR, DO                                 Medical Decision Making 38 year old male with history as above presenting for right ankle injury after jumping out of a moving truck.  He is under police custody.  Isolated right ankle injury.  No deformity.  Circulation sensorimotor exam intact.  Differential diagnosis includes right ankle sprain versus acute fracture.  Will evaluate for right ankle and right foot injury with x-rays  Amount  and/or Complexity of Data Reviewed Radiology: ordered.        Final diagnoses:  Sprain of right ankle, unspecified ligament, initial encounter    ED Discharge Orders     None          Pamella Ozell LABOR, DO 04/09/24 [phone]

## 2024-04-09 NOTE — Progress Notes (Signed)
 Orthopedic Tech Progress Note Patient Details:  Brad Walters 16-Jun-1986 [id number] Applied ankle air cast per order. Ortho Devices Type of Ortho Device: Ankle Air splint Ortho Device/Splint Location: RLE Ortho Device/Splint Interventions: Ordered, Application, Adjustment   Post Interventions Patient Tolerated: Well Instructions Provided: Adjustment of device, Care of device, Poper ambulation with device  Morna Pink 04/09/2024, 8:18 AM

## 2024-09-09 ENCOUNTER — Encounter (HOSPITAL_COMMUNITY): Payer: Self-pay

## 2024-09-09 ENCOUNTER — Ambulatory Visit (HOSPITAL_COMMUNITY)
Admission: EM | Admit: 2024-09-09 | Discharge: 2024-09-09 | Disposition: A | Discharge status: Other Acute Inpatient Hospital

## 2024-09-09 ENCOUNTER — Emergency Department (HOSPITAL_COMMUNITY)
Admission: EM | Admit: 2024-09-09 | Discharge: 2024-09-10 | Disposition: A | Discharge status: Other Acute Inpatient Hospital | Payer: MEDICAID | Attending: Emergency Medicine | Admitting: Emergency Medicine

## 2024-09-09 ENCOUNTER — Other Ambulatory Visit: Payer: Self-pay

## 2024-09-09 DIAGNOSIS — F1520 Other stimulant dependence, uncomplicated: Secondary | ICD-10-CM | POA: Diagnosis present

## 2024-09-09 DIAGNOSIS — F15251 Other stimulant dependence with stimulant-induced psychotic disorder with hallucinations: Secondary | ICD-10-CM | POA: Diagnosis not present

## 2024-09-09 DIAGNOSIS — R55 Syncope and collapse: Secondary | ICD-10-CM | POA: Insufficient documentation

## 2024-09-09 DIAGNOSIS — F15250 Other stimulant dependence with stimulant-induced psychotic disorder with delusions: Secondary | ICD-10-CM | POA: Diagnosis not present

## 2024-09-09 DIAGNOSIS — F29 Unspecified psychosis not due to a substance or known physiological condition: Secondary | ICD-10-CM | POA: Diagnosis not present

## 2024-09-09 DIAGNOSIS — F22 Delusional disorders: Secondary | ICD-10-CM | POA: Insufficient documentation

## 2024-09-09 DIAGNOSIS — R944 Abnormal results of kidney function studies: Secondary | ICD-10-CM | POA: Insufficient documentation

## 2024-09-09 DIAGNOSIS — I959 Hypotension, unspecified: Secondary | ICD-10-CM | POA: Insufficient documentation

## 2024-09-09 LAB — POCT URINE DRUG SCREEN - MANUAL ENTRY (I-SCREEN)
POC Amphetamine UR: POSITIVE — AB
POC Buprenorphine (BUP): NOT DETECTED
POC Cocaine UR: NOT DETECTED
POC Marijuana UR: POSITIVE — AB
POC Methadone UR: NOT DETECTED
POC Methamphetamine UR: POSITIVE — AB
POC Morphine: NOT DETECTED
POC Oxazepam (BZO): NOT DETECTED
POC Oxycodone UR: NOT DETECTED
POC Secobarbital (BAR): NOT DETECTED

## 2024-09-09 LAB — CBC WITH DIFFERENTIAL/PLATELET
Abs Immature Granulocytes: 0.02 K/uL (ref 0.00–0.07)
Basophils Absolute: 0 K/uL (ref 0.0–0.1)
Basophils Relative: 1 %
Eosinophils Absolute: 0.2 K/uL (ref 0.0–0.5)
Eosinophils Relative: 4 %
HCT: 44 % (ref 39.0–52.0)
Hemoglobin: 14.8 g/dL (ref 13.0–17.0)
Immature Granulocytes: 0 %
Lymphocytes Relative: 41 %
Lymphs Abs: 2.1 K/uL (ref 0.7–4.0)
MCH: 30.9 pg (ref 26.0–34.0)
MCHC: 33.6 g/dL (ref 30.0–36.0)
MCV: 91.9 fL (ref 80.0–100.0)
Monocytes Absolute: 0.4 K/uL (ref 0.1–1.0)
Monocytes Relative: 9 %
Neutro Abs: 2.2 K/uL (ref 1.7–7.7)
Neutrophils Relative %: 45 %
Platelets: 254 K/uL (ref 150–400)
RBC: 4.79 MIL/uL (ref 4.22–5.81)
RDW: 12.9 % (ref 11.5–15.5)
WBC: 5 K/uL (ref 4.0–10.5)
nRBC: 0 % (ref 0.0–0.2)

## 2024-09-09 LAB — COMPREHENSIVE METABOLIC PANEL WITH GFR
ALT: 15 U/L (ref 0–44)
AST: 16 U/L (ref 15–41)
Albumin: 4.2 g/dL (ref 3.5–5.0)
Alkaline Phosphatase: 40 U/L (ref 38–126)
Anion gap: 11 (ref 5–15)
BUN: 9 mg/dL (ref 6–20)
CO2: 27 mmol/L (ref 22–32)
Calcium: 9.1 mg/dL (ref 8.9–10.3)
Chloride: 99 mmol/L (ref 98–111)
Creatinine, Ser: 1.3 mg/dL — ABNORMAL HIGH (ref 0.61–1.24)
GFR, Estimated: >60 mL/min (ref >60)
Glucose, Bld: 72 mg/dL (ref 70–99)
Potassium: 4.2 mmol/L (ref 3.5–5.1)
Sodium: 137 mmol/L (ref 135–145)
Total Bilirubin: 0.8 mg/dL (ref 0.0–1.2)
Total Protein: 7.1 g/dL (ref 6.5–8.1)

## 2024-09-09 LAB — HEPATITIS PANEL, ACUTE
HCV Ab: NONREACTIVE
Hep A IgM: NONREACTIVE
Hep B C IgM: NONREACTIVE
Hepatitis B Surface Ag: NONREACTIVE

## 2024-09-09 LAB — ETHANOL: Alcohol, Ethyl (B): <15 mg/dL (ref <15)

## 2024-09-09 LAB — GLUCOSE, CAPILLARY: Glucose-Capillary: 100 mg/dL — ABNORMAL HIGH (ref 70–99)

## 2024-09-09 LAB — TSH: TSH: 0.74 u[IU]/mL (ref 0.350–4.500)

## 2024-09-09 MED ORDER — TRAZODONE HCL 50 MG PO TABS
50.0000 mg | ORAL_TABLET | Freq: Every evening | ORAL | Status: DC | PRN
  Administered 2024-09-09: 50 mg via ORAL
  Filled 2024-09-09: qty 1

## 2024-09-09 MED ORDER — HALOPERIDOL LACTATE 5 MG/ML IJ SOLN: 10.0000 mg | Freq: Three times a day (TID) | INTRAMUSCULAR | Status: DC | PRN

## 2024-09-09 MED ORDER — HALOPERIDOL 5 MG PO TABS: 5.0000 mg | ORAL_TABLET | Freq: Three times a day (TID) | ORAL | Status: DC | PRN

## 2024-09-09 MED ORDER — OLANZAPINE 10 MG PO TABS
10.0000 mg | ORAL_TABLET | Freq: Once | ORAL | Status: AC
  Administered 2024-09-09: 10 mg via ORAL
  Filled 2024-09-09: qty 1

## 2024-09-09 MED ORDER — LORAZEPAM 2 MG/ML IJ SOLN: 2.0000 mg | Freq: Three times a day (TID) | INTRAMUSCULAR | Status: DC | PRN

## 2024-09-09 MED ORDER — DIPHENHYDRAMINE HCL 50 MG/ML IJ SOLN: 50.0000 mg | Freq: Three times a day (TID) | INTRAMUSCULAR | Status: DC | PRN

## 2024-09-09 MED ORDER — OLANZAPINE 10 MG PO TABS: 10.0000 mg | ORAL_TABLET | Freq: Every day | ORAL | Status: DC

## 2024-09-09 MED ORDER — MAGNESIUM HYDROXIDE 400 MG/5ML PO SUSP: 30.0000 mL | Freq: Every day | ORAL | Status: DC | PRN

## 2024-09-09 MED ORDER — HYDROXYZINE HCL 25 MG PO TABS
25.0000 mg | ORAL_TABLET | Freq: Three times a day (TID) | ORAL | Status: DC | PRN
  Administered 2024-09-09: 25 mg via ORAL
  Filled 2024-09-09: qty 1

## 2024-09-09 MED ORDER — LACTATED RINGERS IV BOLUS
1000.0000 mL | Freq: Once | INTRAVENOUS | Status: AC
  Administered 2024-09-10: 1000 mL via INTRAVENOUS

## 2024-09-09 MED ORDER — ALUM & MAG HYDROXIDE-SIMETH 200-200-20 MG/5ML PO SUSP: 30.0000 mL | ORAL | Status: DC | PRN

## 2024-09-09 MED ORDER — DIPHENHYDRAMINE HCL 50 MG PO CAPS: 50.0000 mg | ORAL_CAPSULE | Freq: Three times a day (TID) | ORAL | Status: DC | PRN

## 2024-09-09 MED ORDER — HALOPERIDOL LACTATE 5 MG/ML IJ SOLN: 5.0000 mg | Freq: Three times a day (TID) | INTRAMUSCULAR | Status: DC | PRN

## 2024-09-09 MED ORDER — ACETAMINOPHEN 325 MG PO TABS: 650.0000 mg | ORAL_TABLET | Freq: Four times a day (QID) | ORAL | Status: DC | PRN

## 2024-09-09 MED ORDER — NICOTINE 14 MG/24HR TD PT24
14.0000 mg | MEDICATED_PATCH | Freq: Every day | TRANSDERMAL | Status: DC
  Administered 2024-09-09: 14 mg via TRANSDERMAL
  Filled 2024-09-09: qty 1

## 2024-09-09 NOTE — ED Notes (Signed)
 Pt refusing lab draw or IV fluids, states I dont consent to this. Pt AAOX4. Provider aware

## 2024-09-09 NOTE — ED Notes (Signed)
 Report to Charge Nurse Damien, [facility]. Dr Simon accepting to [facility].  EMS at bedside at present.

## 2024-09-09 NOTE — BH Assessment (Signed)
 Comprehensive Clinical Assessment (CCA) Note  09/09/2024 Brad Walters [id number] DISPOSITION: Patient will be observed in continuous assessment awaiting an inpatient admission.   The patient demonstrates the following risk factors for suicide: Chronic risk factors for suicide include: N/A. Acute risk factors for suicide include: N/A. Protective factors for this patient include: coping skills. Considering these factors, the overall suicide risk at this point appears to be low. Patient is appropriate for outpatient follow up.   Patient is a 39 year old male that presents this date voluntary to [facility] as a walk in with altered mental status brought in by his brother Brad Walters [phone]) after patient's mother (with whom he has been living with for the last 6 months) ask patient to leave her residence after patient had been, acting crazy, for the last two days. Per collateral from brother who stated he was contacted by their mother earlier this date and asked to bring the patient in for a mental health evaluation. Patient renders limited history although denies any SI, HI or AVH. Patient denies any prior mental health diagnosis and history is limited per chart review with the exception when he presented in 2010 with similar symptoms related to substance abuse.  Patient states he has a history of methamphetamine use although renders a conflicting history on arrival first stating he hasn't used any substance in months and later states he used methamphetamines 4 to 5 days ago. Patient's current symptoms appears to be consistent with ongoing use as evidenced by patient being suspicious, guarded and seems to be impaired at the time of assessment (UDS positive for amphetamines and THC).  Patient when asked in reference to his admission in 2010 denies that he was, even here, although states he currently is not involved with any OP services at this time. Patient is also vague in reference to all  the substances he has used currently. Patient will not elaborate on use patterns, length of use, substances and amounts used or last use. Patient gave permission to speak to brother in the lobby.  Per notes, Brother Brad Walters [phone]) says that he suspects that patient is mentally unsound. He has been talking to himself and paranoid for 1.5 years. Six months ago he was kicked out of girlfriend's house, where his two young children live. He has since moved in with mother. Has been arrested for returning and trespassing on girlfriend's property multiple times, with no charges filed. Six months ago, girlfriend posted conversations of threats that patient has sent to her. Since then he has consistently used methamphetamine to cope with his situation at his Mom's house. Brother notes he has been talking to himself. Things came to a head after patient voiced homicidal threats, telling someone who was not visible to kill an unknown party. Brother, on arriving to the house, came across his mother, who had locked herself in the car. She was looking for hospitals for patient to go. Brother suspects he has voiced homicidal threats towards mother. Patient denies any current withdrawals, current legal charges ore access to weapons. To note patient was not forthcoming with information and rendered a conflicting history at times during the assessment.   Patient denies any current mental health symptoms. Patient is alert and oriented x 4. Patient has pressured speech and is observed to be suspicious/guarded. Patient's memory appears to be recent impaired with thoughts somewhat disorganized. Patient's mood is suspicious with affect congruent. Patient does not appear to be responding to internal stimuli.  Chief Complaint:  Chief Complaint  Patient presents with   Addiction Problem   Visit Diagnosis: Substance induced psychosis     CCA Screening, Triage and Referral (STR)  Patient Reported  Information How did you hear about us ? Self  What Is the Reason for Your Visit/Call Today? Brad Walters 38y male presents to [facility] accompanied by his brother. PT states that he is here today to detox from meth. PT states he used everyday. Last use was 4-5 days ago. PT denies SI, HI, AVH and alcohol use.  How Long Has This Been Causing You Problems? 1-6 months  What Do You Feel Would Help You the Most Today? Alcohol or Drug Use Treatment   Have You Recently Had Any Thoughts About Hurting Yourself? No  Are You Planning to Commit Suicide/Harm Yourself At This time? No   Flowsheet Row ED from 09/09/2024 in [facility]  C-SSRS RISK CATEGORY No Risk    Have you Recently Had Thoughts About Hurting Someone Sherral? No  Are You Planning to Harm Someone at This Time? No  Explanation: NA   Have You Used Any Alcohol or Drugs in the Past 24 Hours? No  How Long Ago Did You Use Drugs or Alcohol? Patient states 4 to 5 days ago  What Did You Use and How Much? UTA  Do You Currently Have a Therapist/Psychiatrist? No  Name of Therapist/Psychiatrist:  No  Have You Been Recently Discharged From Any Office Practice or Programs? No  Explanation of Discharge From Practice/Program: NA    CCA Screening Triage Referral Assessment Type of Contact: Face-to-Face  Telemedicine Service Delivery: face to face   Is this Initial or Reassessment? Initial   Date Telepsych consult ordered in [facility]: 09/09/2024   Time Telepsych consult ordered in [facility]:  11600  Location of Assessment: [facility] Assessment [facility]  Provider Location: [facility] Assessment Services   Collateral Involvement: Brother who is present   Does Patient Have a Automotive Engineer Guardian? No  Legal Guardian Contact Information: NA  Copy of Legal Guardianship Form: -- (NA)  Legal Guardian Notified of Arrival: -- (NA)  Legal Guardian Notified of Pending Discharge: -- (NA)  If Minor and Not Living with  Parent(s), Who has Custody? NA  Is CPS involved or ever been involved? Never  Is APS involved or ever been involved? Never   Patient Determined To Be At Risk for Harm To Self or Others Based on Review of Patient Reported Information or Presenting Complaint? No  Method: No Plan  Availability of Means: No access or NA  Intent: Vague intent or NA  Notification Required: No need or identified person  Additional Information for Danger to Others Potential: -- (NA)  Additional Comments for Danger to Others Potential: NA  Are There Guns or Other Weapons in Your Home? No  Types of Guns/Weapons: NA  Are These Weapons Safely Secured?                            -- (NA)  Who Could Verify You Are Able To Have These Secured: NA  Do You Have any Outstanding Charges, Pending Court Dates, Parole/Probation? Patient denies  Contacted To Inform of Risk of Harm To Self or Others: Other: Comment (NA)    Does Patient Present under Involuntary Commitment? No    [place] of Residence: [place]   Patient Currently Receiving the Following Services: Not Receiving Services   Determination of Need:  Urgent (48 hours)   Options For Referral: Inpatient Hospitalization     CCA Biopsychosocial Patient Reported Schizophrenia/Schizoaffective Diagnosis in Past: No   Strengths: Patient is willing to participate in treatment   Mental Health Symptoms Depression:  Change in energy/activity; Hopelessness; Worthlessness   Duration of Depressive symptoms: Duration of Depressive Symptoms: Greater than two weeks   Mania:  None   Anxiety:   Difficulty concentrating   Psychosis:  None   Duration of Psychotic symptoms: Duration of Psychotic Symptoms: N/A   Trauma:  None   Obsessions:  None   Compulsions:  None   Inattention:  None   Hyperactivity/Impulsivity:  None   Oppositional/Defiant Behaviors:  None   Emotional Irregularity:  None   Other Mood/Personality Symptoms:  NA None  identified    Mental Status Exam Appearance and self-care  Stature:  Average   Weight:  Average weight   Clothing:  Casual   Grooming:  Normal   Cosmetic use:  None   Posture/gait:  Bizarre   Motor activity:  Agitated   Sensorium  Attention:  Confused   Concentration:  Anxiety interferes   Orientation:  X5   Recall/memory:  Normal   Affect and Mood  Affect:  Anxious   Mood:  Anxious   Relating  Eye contact:  Normal   Facial expression:  Anxious   Attitude toward examiner:  Defensive   Thought and Language  Speech flow: Clear and Coherent   Thought content:  Suspicious   Preoccupation:  None   Hallucinations:  None   Organization:  Disorganized   Company Secretary of Knowledge:  Poor   Intelligence:  Average   Abstraction:  Functional   Judgement:  Impaired   Reality Testing:  Distorted   Insight:  Lacking   Decision Making:  Confused   Social Functioning  Social Maturity:  Isolates   Social Judgement:  Chief Of Staff   Stress  Stressors:  Family conflict   Coping Ability:  Human Resources Officer Deficits:  None   Supports:  Support needed     Religion: Religion/Spirituality Are You A Religious Person?: No How Might This Affect Treatment?: NA  Leisure/Recreation: Leisure / Recreation Do You Have Hobbies?: No  Exercise/Diet: Exercise/Diet Do You Exercise?: No Have You Gained or Lost A Significant Amount of Weight in the Past Six Months?: No Do You Follow a Special Diet?: No Do You Have Any Trouble Sleeping?: No   CCA Employment/Education Employment/Work Situation: Employment / Work Situation Employment Situation: Unemployed Patient's Job has Been Impacted by Current Illness: No Has Patient ever Been in Equities Trader?: No  Education: Education Is Patient Currently Attending School?: No Last Grade Completed: 12 Did You Product Manager?: No Did You Have An Individualized Education Program (IIEP): No Did You  Have Any Difficulty At [employer]?: No Patient's Education Has Been Impacted by Current Illness: No   CCA Family/Childhood History Family and Relationship History: Family history Marital status: Single Does patient have children?: Yes How many children?: 2 How is patient's relationship with their children?: Patient states they are currently with their mother  Childhood History:  Childhood History By whom was/is the patient raised?: Mother Did patient suffer any verbal/emotional/physical/sexual abuse as a child?: No Did patient suffer from severe childhood neglect?: No Has patient ever been sexually abused/assaulted/raped as an adolescent or adult?: No Was the patient ever a victim of a crime or a disaster?: No Witnessed domestic violence?: No Has patient been affected by domestic violence as  an adult?: No       CCA Substance Use Alcohol/Drug Use: Alcohol / Drug Use Pain Medications: See MAR Prescriptions: See MAR Over the Counter: See MAR History of alcohol / drug use?: Yes Longest period of sobriety (when/how long): Unknown Negative Consequences of Use: Personal relationships Withdrawal Symptoms: None Substance #1 Name of Substance 1: Methamphetamines (per history although patient denies current use) 1 - Age of First Use: UTA 1 - Amount (size/oz): UTA 1 - Frequency: UTA 1 - Duration: UTA 1 - Last Use / Amount: UTA 1 - Method of Aquiring: UTA 1- Route of Use: UTA                       ASAM's:  Six Dimensions of Multidimensional Assessment  Dimension 1:  Acute Intoxication and/or Withdrawal Potential:      Dimension 2:  Biomedical Conditions and Complications:      Dimension 3:  Emotional, Behavioral, or Cognitive Conditions and Complications:     Dimension 4:  Readiness to Change:     Dimension 5:  Relapse, Continued use, or Continued Problem Potential:     Dimension 6:  Recovery/Living Environment:     ASAM Severity Score:    ASAM Recommended Level of  Treatment: ASAM Recommended Level of Treatment:  (NA)   Substance use Disorder (SUD) Substance Use Disorder (SUD)  Checklist Symptoms of Substance Use:  (NA)  Recommendations for Services/Supports/Treatments: Recommendations for Services/Supports/Treatments Recommendations For Services/Supports/Treatments:  (NA)  Disposition Recommendation per psychiatric provider: We recommend inpatient psychiatric hospitalization when medically cleared. Patient is under voluntary admission status at this time; please IVC if attempts to leave hospital.   DSM5 Diagnoses: Patient Active Problem List   Diagnosis Date Noted   Methamphetamine use disorder, severe (HCC) 09/09/2024   Psychosis (HCC) 09/09/2024     Referrals to Alternative Service(s): Referred to Alternative Service(s):   Place:   Date:   Time:    Referred to Alternative Service(s):   Place:   Date:   Time:    Referred to Alternative Service(s):   Place:   Date:   Time:    Referred to Alternative Service(s):   Place:   Date:   Time:     Alm LITTIE Furth, LCAS
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
## 2024-09-09 NOTE — ED Provider Notes (Signed)
 Writer was notified by nursing staff that patient had a syncope episode.  Upon entering unit patient is observed on the floor.  At first patient was not responding.  Vital signs were taken, blood sugars were taken, after about 2 minutes writer did give patient to sternal rub and patient responded by saying ouch.  Patient also was able to repeat his name, and also patient was able to state where he was at at the moment.  Patient is alert and oriented to person place.  However patient blood pressure was running low.  EMS was called the hospital was notified and patient will be sent out to the hospital for medical clearance.

## 2024-09-09 NOTE — ED Notes (Signed)
 Pt sleeping, will not engage for assessment questions. He will only grunt. He will not answer questions. No noted distress. Safety check completed. Will continue to monitor for safety

## 2024-09-10 ENCOUNTER — Other Ambulatory Visit: Payer: Self-pay

## 2024-09-10 ENCOUNTER — Inpatient Hospital Stay (HOSPITAL_COMMUNITY): Admission: AD | Admit: 2024-09-10 | Discharge: 2024-09-21 | DRG: 897 | Disposition: A | Source: Intra-hospital

## 2024-09-10 ENCOUNTER — Encounter (HOSPITAL_COMMUNITY): Payer: Self-pay | Admitting: Student in an Organized Health Care Education/Training Program

## 2024-09-10 ENCOUNTER — Emergency Department (HOSPITAL_COMMUNITY): Payer: Self-pay

## 2024-09-10 DIAGNOSIS — F1520 Other stimulant dependence, uncomplicated: Secondary | ICD-10-CM | POA: Diagnosis present

## 2024-09-10 DIAGNOSIS — F1590 Other stimulant use, unspecified, uncomplicated: Secondary | ICD-10-CM | POA: Insufficient documentation

## 2024-09-10 DIAGNOSIS — F29 Unspecified psychosis not due to a substance or known physiological condition: Principal | ICD-10-CM | POA: Diagnosis present

## 2024-09-10 DIAGNOSIS — F19959 Other psychoactive substance use, unspecified with psychoactive substance-induced psychotic disorder, unspecified: Principal | ICD-10-CM | POA: Insufficient documentation

## 2024-09-10 LAB — CBC WITH DIFFERENTIAL/PLATELET
Abs Immature Granulocytes: 0.01 K/uL (ref 0.00–0.07)
Basophils Absolute: 0 K/uL (ref 0.0–0.1)
Basophils Relative: 1 %
Eosinophils Absolute: 0.3 K/uL (ref 0.0–0.5)
Eosinophils Relative: 5 %
HCT: 40.2 % (ref 39.0–52.0)
Hemoglobin: 13.4 g/dL (ref 13.0–17.0)
Immature Granulocytes: 0 %
Lymphocytes Relative: 39 %
Lymphs Abs: 2.4 K/uL (ref 0.7–4.0)
MCH: 30.7 pg (ref 26.0–34.0)
MCHC: 33.3 g/dL (ref 30.0–36.0)
MCV: 92 fL (ref 80.0–100.0)
Monocytes Absolute: 0.6 K/uL (ref 0.1–1.0)
Monocytes Relative: 9 %
Neutro Abs: 2.8 K/uL (ref 1.7–7.7)
Neutrophils Relative %: 46 %
Platelets: 242 K/uL (ref 150–400)
RBC: 4.37 MIL/uL (ref 4.22–5.81)
RDW: 12.8 % (ref 11.5–15.5)
WBC: 6 K/uL (ref 4.0–10.5)
nRBC: 0 % (ref 0.0–0.2)

## 2024-09-10 LAB — COMPREHENSIVE METABOLIC PANEL WITH GFR
ALT: 12 U/L (ref 0–44)
AST: 17 U/L (ref 15–41)
Albumin: 3.5 g/dL (ref 3.5–5.0)
Alkaline Phosphatase: 35 U/L — ABNORMAL LOW (ref 38–126)
Anion gap: 10 (ref 5–15)
BUN: 14 mg/dL (ref 6–20)
CO2: 26 mmol/L (ref 22–32)
Calcium: 8.8 mg/dL — ABNORMAL LOW (ref 8.9–10.3)
Chloride: 102 mmol/L (ref 98–111)
Creatinine, Ser: 1.26 mg/dL — ABNORMAL HIGH (ref 0.61–1.24)
GFR, Estimated: >60 mL/min (ref >60)
Glucose, Bld: 126 mg/dL — ABNORMAL HIGH (ref 70–99)
Potassium: 3.5 mmol/L (ref 3.5–5.1)
Sodium: 138 mmol/L (ref 135–145)
Total Bilirubin: 0.6 mg/dL (ref 0.0–1.2)
Total Protein: 6 g/dL — ABNORMAL LOW (ref 6.5–8.1)

## 2024-09-10 LAB — RAPID URINE DRUG SCREEN, HOSP PERFORMED
Amphetamines: POSITIVE — AB
Barbiturates: NOT DETECTED
Benzodiazepines: NOT DETECTED
Cocaine: NOT DETECTED
Opiates: NOT DETECTED
Tetrahydrocannabinol: NOT DETECTED

## 2024-09-10 LAB — MAGNESIUM: Magnesium: 1.9 mg/dL (ref 1.7–2.4)

## 2024-09-10 LAB — HEMOGLOBIN A1C
Hgb A1c MFr Bld: 5.6 % (ref 4.8–5.6)
Mean Plasma Glucose: 114 mg/dL

## 2024-09-10 LAB — SALICYLATE LEVEL: Salicylate Lvl: <7 mg/dL — ABNORMAL LOW (ref 7.0–30.0)

## 2024-09-10 LAB — CBG MONITORING, ED: Glucose-Capillary: 116 mg/dL — ABNORMAL HIGH (ref 70–99)

## 2024-09-10 LAB — ETHANOL: Alcohol, Ethyl (B): <15 mg/dL (ref <15)

## 2024-09-10 LAB — ACETAMINOPHEN LEVEL: Acetaminophen (Tylenol), Serum: <10 ug/mL — ABNORMAL LOW (ref 10–30)

## 2024-09-10 LAB — CK: Total CK: 90 U/L (ref 49–397)

## 2024-09-10 LAB — SYPHILIS: RPR W/REFLEX TO RPR TITER AND TREPONEMAL ANTIBODIES, TRADITIONAL SCREENING AND DIAGNOSIS ALGORITHM: RPR Ser Ql: NONREACTIVE

## 2024-09-10 LAB — I-STAT CG4 LACTIC ACID, ED: Lactic Acid, Venous: 1.5 mmol/L (ref 0.5–1.9)

## 2024-09-10 MED ORDER — NICOTINE 14 MG/24HR TD PT24
14.0000 mg | MEDICATED_PATCH | Freq: Every day | TRANSDERMAL | Status: DC
  Administered 2024-09-11 – 2024-09-19 (×4): 14 mg via TRANSDERMAL
  Filled 2024-09-10 (×9): qty 1

## 2024-09-10 MED ORDER — MAGNESIUM HYDROXIDE 400 MG/5ML PO SUSP: 30.0000 mL | Freq: Every day | ORAL | Status: DC | PRN

## 2024-09-10 MED ORDER — LORAZEPAM 2 MG/ML IJ SOLN: 2.0000 mg | Freq: Three times a day (TID) | INTRAMUSCULAR | Status: DC | PRN

## 2024-09-10 MED ORDER — ALUM & MAG HYDROXIDE-SIMETH 200-200-20 MG/5ML PO SUSP: 30.0000 mL | ORAL | Status: DC | PRN

## 2024-09-10 MED ORDER — HALOPERIDOL LACTATE 5 MG/ML IJ SOLN
5.0000 mg | Freq: Three times a day (TID) | INTRAMUSCULAR | Status: DC | PRN
  Administered 2024-09-16: 5 mg via INTRAMUSCULAR
  Filled 2024-09-10: qty 1

## 2024-09-10 MED ORDER — LORAZEPAM 2 MG/ML IJ SOLN
2.0000 mg | Freq: Three times a day (TID) | INTRAMUSCULAR | Status: DC | PRN
  Administered 2024-09-16: 2 mg via INTRAMUSCULAR
  Filled 2024-09-10: qty 1

## 2024-09-10 MED ORDER — DIPHENHYDRAMINE HCL 50 MG/ML IJ SOLN
50.0000 mg | Freq: Three times a day (TID) | INTRAMUSCULAR | Status: DC | PRN
  Administered 2024-09-16: 50 mg via INTRAMUSCULAR

## 2024-09-10 MED ORDER — DIPHENHYDRAMINE HCL 50 MG/ML IJ SOLN
50.0000 mg | Freq: Three times a day (TID) | INTRAMUSCULAR | Status: DC | PRN
  Filled 2024-09-10: qty 1

## 2024-09-10 MED ORDER — ACETAMINOPHEN 325 MG PO TABS
650.0000 mg | ORAL_TABLET | Freq: Four times a day (QID) | ORAL | Status: DC | PRN
  Administered 2024-09-11: 650 mg via ORAL
  Filled 2024-09-10: qty 2

## 2024-09-10 MED ORDER — LACTATED RINGERS IV BOLUS
1000.0000 mL | Freq: Once | INTRAVENOUS | Status: AC
  Administered 2024-09-10: 1000 mL via INTRAVENOUS

## 2024-09-10 MED ORDER — HALOPERIDOL 5 MG PO TABS: 5.0000 mg | ORAL_TABLET | Freq: Three times a day (TID) | ORAL | Status: DC | PRN

## 2024-09-10 MED ORDER — DIPHENHYDRAMINE HCL 25 MG PO CAPS: 50.0000 mg | ORAL_CAPSULE | Freq: Three times a day (TID) | ORAL | Status: DC | PRN

## 2024-09-10 MED ORDER — HALOPERIDOL LACTATE 5 MG/ML IJ SOLN: 10.0000 mg | Freq: Three times a day (TID) | INTRAMUSCULAR | Status: DC | PRN

## 2024-09-10 NOTE — ED Provider Notes (Signed)
 Care assumed from [place], [place] at shift change. Please see their note for further information.  Physical Exam  BP (!) 76/47 (BP Location: Left Arm)   Pulse (!) 51   Temp (!) 97.3 F (36.3 C) (Axillary)   Resp 12   SpO2 100%   Physical Exam Vitals and nursing note reviewed.  Constitutional:      General: He is not in acute distress.    Appearance: Normal appearance. He is normal weight. He is not ill-appearing, toxic-appearing or diaphoretic.  HENT:     Head: Normocephalic and atraumatic.  Cardiovascular:     Rate and Rhythm: Normal rate.  Pulmonary:     Effort: Pulmonary effort is normal. No respiratory distress.  Musculoskeletal:        General: Normal range of motion.     Cervical back: Normal range of motion.  Skin:    General: Skin is warm and dry.  Neurological:     General: No focal deficit present.     Mental Status: He is alert.  Psychiatric:        Mood and Affect: Mood normal.        Behavior: Behavior normal.     Procedures  Procedures  ED Course / MDM    Medical Decision Making Amount and/or Complexity of Data Reviewed Labs: ordered. Radiology: ordered. ECG/medicine tests: ordered.   Briefly: Patient from [facility] for syncope with hypotension. IVC  Plan: Labs and imaging benign, however persistently hypotensive, suspected from drug use. Currently getting third liter of fluids. Plan to reassess after fluids, ambulate, and recheck bp. If improved --> TTS. If persistently hypotensive, will need admission. Patient alert and oriented.   Upon my assessment, patient resting comfortably in bed, arousable but agitated, alert and oriented. Denies complaints. Continues to be hypotensive.   Patient able to sit up in bed and eat breakfast, conversational. Blood pressures improved, therefore medically cleared for TTS who have been consulted, appreciate their recommendations    Nora Lauraine LABOR, PA-C 09/10/24 1004    Kingsley, Victoria K, DO 09/10/24 1529

## 2024-09-10 NOTE — ED Notes (Signed)
 Pt requesting another sandwich and ginger ale. Pt is eating them now.

## 2024-09-10 NOTE — ED Notes (Signed)
 Pt agreeable for IV at this time

## 2024-09-10 NOTE — ED Notes (Signed)
 Pt sleeping.

## 2024-09-10 NOTE — ED Notes (Signed)
 BH RN stated they wanted followup with provider regarding VS prior to arriving to unit

## 2024-09-10 NOTE — Group Note (Signed)
 Date:  09/10/2024 Time:  9:09 PM  Group Topic/Focus:  Wrap-Up Group:   The focus of this group is to help patients review their daily goal of treatment and discuss progress on daily workbooks.    Participation Level:  Did Not Attend   Brad Walters 09/10/2024, 9:09 PM

## 2024-09-10 NOTE — ED Provider Notes (Signed)
 Pt accepted for placement at [facility] Dr Raliegh Pereyra, Jayson LABOR, DO 09/10/24 1654

## 2024-09-10 NOTE — ED Provider Notes (Signed)
 [facility]-EMERGENCY DEPT [facility] Emergency Department Provider Note MRN:  [id number]  Arrival date & time: 09/10/24     Chief Complaint   Hypotension   History of Present Illness   Brad Walters is a 38 y.o. year-old male presents to the ED with chief complaint of syncope.  Patient sent over from [facility] for syncope.  Was being evaluated for detox from meth.  Had been having episodes of paranoia and homicidal thoughts.    Patient reportedly passed out and was noted to have low blood pressure.  He was transported to [facility] for evaluation and treatment.  Patient denies pain, fever, recent illness.  He states that he is tired.  He reports that he has been using drugs.  He denies chest pain, SOB, n/v/d.  History provided by patient.   Review of Systems  Pertinent positive and negative review of systems noted in HPI.    Physical Exam   Vitals:   09/10/24 0230 09/10/24 0250  BP: (!) 92/53 (!) 94/54  Pulse: 61 60  Resp:  14  Temp:    SpO2: 100% 100%    CONSTITUTIONAL:  drowsy-appearing, NAD NEURO:  Alert and oriented x 3, CN 3-12 grossly intact EYES:  eyes equal and reactive ENT/NECK:  Supple, no stridor  CARDIO:  normal rate, regular rhythm, appears well-perfused  PULM:  No respiratory distress, CTAB GI/GU:  non-distended,  MSK/SPINE:  No gross deformities, no edema, moves all extremities  SKIN:  no rash, atraumatic   *Additional and/or pertinent findings included in MDM below  Diagnostic and Interventional Summary    EKG Interpretation Date/Time:    Ventricular Rate:    PR Interval:    QRS Duration:    QT Interval:    QTC Calculation:   R Axis:      Text Interpretation:         Labs Reviewed  COMPREHENSIVE METABOLIC PANEL WITH GFR - Abnormal; Notable for the following components:      Result Value   Glucose, Bld 126 (*)    Creatinine, Ser 1.26 (*)    Calcium 8.8 (*)    Total Protein 6.0 (*)    Alkaline Phosphatase 35 (*)    All other components  within normal limits  CBG MONITORING, ED - Abnormal; Notable for the following components:   Glucose-Capillary 116 (*)    All other components within normal limits  CBC WITH DIFFERENTIAL/PLATELET  MAGNESIUM   ETHANOL  RAPID URINE DRUG SCREEN, HOSP PERFORMED    DG Chest Port 1 View  Final Result      Medications  lactated ringers  bolus 1,000 mL (has no administration in time range)  lactated ringers  bolus 1,000 mL (has no administration in time range)  lactated ringers  bolus 1,000 mL (1,000 mLs Intravenous New Bag/Given 09/10/24 0130)  lactated ringers  bolus 1,000 mL (0 mLs Intravenous Stopped 09/10/24 0131)     Procedures  /  Critical Care Procedures  ED Course and Medical Decision Making  I have reviewed the triage vital signs, the nursing notes, and pertinent available records from the EMR.  Social Determinants Affecting Complexity of Care: Patient has no clinically significant social determinants affecting this chief complaint..   ED Course:    Medical Decision Making Patient here for evaluation of syncope while at [facility].  He is quite drowsy, but will wake up and answer questions and then fall back to sleep.SABRA  He denies pain.  He does not look toxic.  His BPs have been on the lower  side, but he is thin and has been asleep.  His BP has improved with some fluid.  Labs are notable for mildly increased creatinine.  He may be dry and volume down from frequent drug use.  Will continue with fluids.  Patient is not orthostatic, but BPs are still low after a couple of liters of fluid.  Will give an additional.  If BPs don't start to recover as patient wakes up/metabolizes whatever he has in his system, then he will need to be admitted, but I suspect that as he wakes up his BP will come up and then he can be medically cleared for TTS evaluation.  UDS is also still pending.  I discussed the treatment plan with Dr. Lorette, who is in agreement.  Amount and/or Complexity of Data  Reviewed Labs: ordered. Radiology: ordered. ECG/medicine tests: ordered.         Consultants:    Treatment and Plan: Patient signed out to oncoming team, who will continue care.    Final Clinical Impressions(s) / ED Diagnoses  No diagnosis found.  ED Discharge Orders     None         Discharge Instructions Discussed with and Provided to Patient:   Discharge Instructions   None      Carsten, [place], PA-C 09/10/24 9376    Mesner, Selinda, MD 09/10/24 [phone]

## 2024-09-10 NOTE — ED Notes (Signed)
 PT resistant to being taken IVC with [employer], [facility] RN Ramond made aware of situation.
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
## 2024-09-10 NOTE — ED Notes (Signed)
 Magistrate contacted to obtain findings and custody order from [employer].

## 2024-09-10 NOTE — Progress Notes (Signed)
 Pt has been accepted to [facility] on 09/10/2024 Bed assignment: 502-01  Pt meets inpatient criteria per: Jerel Gravely NP  Attending Physician will be: Leita Arts MD  Report can be called un:lwpu: Adult unit: [phone]  Pt can arrive after [facility] WILL UPDATE   Care Team Notified: [facility] RN, Jerel Gravely NP  [place] Serafin Decatur LCSW-A   09/10/2024 1:09 PM

## 2024-09-10 NOTE — ED Notes (Signed)
 Pt ambulated out of bathroom and slumped to floor after a few steps.  Pt dazed, and barely responding to name.  NP Gaither Pouch and NP Chinwendu Onuoha at bedside to eval pt.  Pt diaphoretic. CBG obtained-100.  Pt remains hypotensive.  EMS at bedside to transport pt to [facility].

## 2024-09-10 NOTE — ED Notes (Signed)
 PT belongings placed in locker #3, 1 Bick paper bag

## 2024-09-10 NOTE — Tx Team (Signed)
 Initial Treatment Plan 09/10/2024 7:05 PM Brad Walters [id number]    PATIENT STRESSORS: Health problems   Substance abuse     PATIENT STRENGTHS: Ability for insight  Communication skills    PATIENT IDENTIFIED PROBLEMS: Substance Abuse  Psychosis  Ineffective coping skills                 DISCHARGE CRITERIA:  Ability to meet basic life and health needs Adequate post-discharge living arrangements  PRELIMINARY DISCHARGE PLAN: Attend aftercare/continuing care group Outpatient therapy Return to previous living arrangement  PATIENT/FAMILY INVOLVEMENT: This treatment plan has been presented to and reviewed with the patient, Brad Walters, and/or family member.  The patient and family have been given the opportunity to ask questions and make suggestions.  Brad MALVA Lowers, RN 09/10/2024, 7:05 PM

## 2024-09-10 NOTE — ED Notes (Signed)
 Envelope Number: [id number] Affidavit and Petition along with the first exam has been uploaded to the patient's chart and e-filed to the [place] of Courts.  Magistrate contacted,at 8:06am, but no answer. IVC documents located on the clipboard in the blue zone at the nurse's station.

## 2024-09-10 NOTE — ED Notes (Signed)
 Pt given sandwich bag and cola, pt eating now.

## 2024-09-11 MED ORDER — OLANZAPINE 7.5 MG PO TABS
7.5000 mg | ORAL_TABLET | Freq: Two times a day (BID) | ORAL | Status: DC
  Administered 2024-09-11 – 2024-09-14 (×6): 7.5 mg via ORAL
  Filled 2024-09-11 (×6): qty 1

## 2024-09-11 NOTE — [facility] Counselor (Signed)
 Adult Comprehensive Assessment  Patient ID: Chaddrick Brue, male   DOB: 1985/12/08, 38 y.o.   MRN: [id number]  Information Source: Information source: Patient  Current Stressors:  Patient states their primary concerns and needs for treatment are:: I was trying to get help from the police harrassing me for about a year and a half. Educational / Learning stressors: None reported. Employment / Job issues: None reported. Family Relationships: None reported. Financial / Lack of resources (include bankruptcy): None reported. Housing / Lack of housing: None reported. Physical health (include injuries & life threatening diseases): None reported. Social relationships: None reported. Substance abuse: None reported. Bereavement / Loss: None reported.  Living/Environment/Situation:  Living Arrangements: Other (Comment) Living conditions (as described by patient or guardian): Patient lives in an apartment. Who else lives in the home?: Patient lives with his mother. How long has patient lived in current situation?: Four month. What is atmosphere in current home: Comfortable  Family History:  Marital status: Single Are you sexually active?: Yes What is your sexual orientation?: Heterosexual /He/Him Has your sexual activity been affected by drugs, alcohol, medication, or emotional stress?: No Does patient have children?: Yes How many children?: 2 How is patient's relationship with their children?: Not recently.  Childhood History:  By whom was/is the patient raised?: Mother Description of patient's relationship with caregiver when they were a child: Good Patient's description of current relationship with people who raised him/her: Good How were you disciplined when you got in trouble as a child/adolescent?: Spankings Does patient have siblings?: Yes Number of Siblings: 1 Description of patient's current relationship with siblings: Good relationship with older brother. Did  patient suffer any verbal/emotional/physical/sexual abuse as a child?: No Did patient suffer from severe childhood neglect?: No Has patient ever been sexually abused/assaulted/raped as an adolescent or adult?: No Was the patient ever a victim of a crime or a disaster?: No Witnessed domestic violence?: No Has patient been affected by domestic violence as an adult?: No  Education:  Highest grade of school patient has completed: Some college for biology. Currently a student?: No Learning disability?: No  Employment/Work Situation:   Employment Situation: Unemployed Patient's Job has Been Impacted by Current Illness: No What is the Longest Time Patient has Held a Job?: Eight years. Where was the Patient Employed at that Time?: [employer] - selling [employer]. Has Patient ever Been in the [employer]?: No  Financial Resources:   Financial resources: No income (Family.) Does patient have a lawyer or guardian?: No  Alcohol/Substance Abuse:   What has been your use of drugs/alcohol within the last 12 months?: Meth- 'started [age] months age Use - not alot, sometimes marijuanna use not alot started smoking age [age],cigarettes started smoking age 89 - use half a pack daily. If attempted suicide, did drugs/alcohol play a role in this?: No Alcohol/Substance Abuse Treatment Hx: Denies past history Has alcohol/substance abuse ever caused legal problems?: Yes (Four cases right now.)  Social Support System:   Patient's Community Support System: None Type of faith/religion: Sherlean How does patient's faith help to cope with current illness?: Not reported  Leisure/Recreation:   Do You Have Hobbies?: Yes Leisure and Hobbies: [place] out with friends, music.  Strengths/Needs:   What is the patient's perception of their strengths?: Music Patient states they can use these personal strengths during their treatment to contribute to their recovery: Talk about  it. Patient states these barriers may affect/interfere with their treatment: None reported. Patient states these barriers may affect their return  to the community: None reported. Other important information patient would like considered in planning for their treatment: None reported.  Discharge Plan:   Currently receiving community mental health services: No Patient states concerns and preferences for aftercare planning are: None reported. Patient states they will know when they are safe and ready for discharge when: I don't know. Patient denies current SI/HI -AVH Does patient have access to transportation?: Yes (Patient's brother to transport.) Does patient have financial barriers related to discharge medications?: No Patient description of barriers related to discharge medications: Patient states no insurance. Will patient be returning to same living situation after discharge?: Yes (Patient will return home.)  Summary/Recommendations:   Kenroy is a 38 year old-male presents to the ED with chief complaint of hypotension and syncope.  Patient was admitted from [facility] ED under IVC status for substance use, and psychotic features. Client denies current SI/HI.   Per chart review:  Patient has past psychiatric history of polysubstance use. Patient initially arrived to [facility] on 09/09/24 seeking methamphetamine detox and was admitted to [facility] under IVC on 09/10/24 for severe substance-induced psychosis or mood disturbances. Patient has no significant past medical history.  Chief Complaint:  Psychoactive substance-induced psychosis (HCC) [F19.959] Principal Diagnosis:  Psychoactive substance-induced psychosis (HCC) Diagnosis:  Principal Problem: Psychoactive substance-induced psychosis (HCC)   Patient states their primary concerns and needs for treatment are: I was trying to get help from the police harassing me for about a year and a half" denying other stressors.   Patient reports he  and his mother have lived in their current residence for four months, he has two children, and denies current support system,   Patient reports methamphetamine use began six months ago, marijuana use since age 55, and smoking a half pack of cigarettes since age 47. Patient denies past substance treatment and reports four open legal cases.  Patient denied current behavioral health provider; patient will return home after discharge; patient's brother to transport.   Patient will benefit from crisis stabilization, medication evaluation, group therapy and psychoeducation, in addition to case management for discharge planning. At discharge it is recommended that Patient adhere to the established discharge plan and continue in treatment.     Dominick Morella JONELLE Lever. 09/11/2024

## 2024-09-11 NOTE — Group Note (Signed)
 LCSW Group Therapy Note  Group Date: 09/11/2024 Start Time: 1040 End Time: 1140   Type of Therapy and Topic:  Group Therapy: Positive Affirmations  Participation Level:  Active   Description of Group:   This group addressed positive affirmation towards self and others.  Patients went around the room and identified two positive things about themselves and two positive things about a peer in the room.  Patients reflected on how it felt to share something positive with others, to identify positive things about themselves, and to hear positive things from others/ Patients were encouraged to have a daily reflection of positive characteristics or circumstances.   Therapeutic Goals: Patients will verbalize two of their positive qualities Patients will demonstrate empathy for others by stating two positive qualities about a peer in the group Patients will verbalize their feelings when voicing positive self affirmations and when voicing positive affirmations of others Patients will discuss the potential positive impact on their wellness/recovery of focusing on positive traits of self and others.  Summary of Patient Progress:  Shneur actively engaged in the discussion and . He was able to identify positive affirmations about himself as well as other group members. Patient demonstrated good insight into the subject matter, was respectful of peers, participated throughout the entire session.  Therapeutic Modalities:   Cognitive Behavioral Therapy Motivational Interviewing    Hunter JONELLE Lever, ISRAEL 09/11/2024  4:45 PM

## 2024-09-11 NOTE — Plan of Care (Signed)
   Problem: Education: Goal: Mental status will improve Outcome: Not Progressing

## 2024-09-11 NOTE — [facility] Suicide Risk Assessment (Signed)
 [facility] INPATIENT:  Family/Significant Other Suicide Prevention Education  Suicide Prevention Education:  Contact Attempts: [name] brother [phone], (family member) has been identified by the patient as the family member with whom the patient will be residing, and identified as the person(s) who will aid the patient in the event of a mental health crisis.  With written consent from the patient, an attempt was made to provide suicide prevention education, prior to and/or following the patient's discharge.  We were unsuccessful in providing suicide prevention education and will attempt to contact the family member at a later time.    Date and time of first attempt: 09/03/2024  1:21 pm   Brad Walters 09/11/2024, 1:20 PM

## 2024-09-11 NOTE — [facility] Group Notes (Signed)
 Adult Psychoeducational Group Note  Date:  09/11/2024 Time:  10:11 AM  Group Topic/Focus: Goals Group Goals Group:   The focus of this group is to help patients establish daily goals to achieve during treatment and discuss how the patient can incorporate goal setting into their daily lives to aide in recovery.  Participation Level:  Did Not Attend  Participation Quality:    Affect:    Cognitive:    Insight:   Engagement in Group:    Modes of Intervention:    Additional Comments:    Annett Berle Hoyer 09/11/2024, 10:11 AM

## 2024-09-11 NOTE — Group Note (Signed)
 Date:  09/11/2024 Time:  6:25 PM  Group Topic/Focus:  Music therapy    Participation Level:  Did Not Attend  Additional Comments:  Pt guarded, isolative to room majority of this shift despite multiple prompts to attend groups.   Brad Walters 09/11/2024, 6:25 PM

## 2024-09-11 NOTE — Progress Notes (Signed)
(  Sleep Hours) -8 (Any PRNs that were needed, meds refused, or side effects to meds)- Tylenol  (Any disturbances and when (visitation, over night)-none (Concerns raised by the patient)- pt stated he has migraines and stated he would prefer to have Excedrin. (SI/HI/AVH)-denied

## 2024-09-11 NOTE — Plan of Care (Addendum)
 Pt A & O to self and place. Observed with blank affect, fair eye contact, guarded, minimal and concrete on interaction. Per pt I'm here because my brother told me if I come in he will help me pay my lawyer fee for my case. Denies SI, HI, AVH and pain Not right now when asked. Stated It not a feeling, it's a fact that someone was following me months now.  Tolerates meals, medications and fluids well. Support, encouragement and reassurance offered. Safety checks maintained at Q 15 minutes intervals.   Problem: Activity: Goal: Sleeping patterns will improve Outcome:  Progressing   Problem: Safety: Goal: Periods of time without injury will increase Outcome: Progressing

## 2024-09-11 NOTE — H&P (Signed)
 Psychiatric Admission Assessment Adult  Patient Identification:  Brad Walters MRN:  [id number] Date of Evaluation:  09/11/2024 Chief Complaint:  Psychoactive substance-induced psychosis (HCC) [F19.959] Principal Diagnosis:  Psychoactive substance-induced psychosis (HCC) Diagnosis:  Principal Problem:   Psychoactive substance-induced psychosis (HCC)   SUBJECTIVE:  HPI: Brad Walters is a 38 y.o. male  with a past psychiatric history of polysubstance use. Patient initially arrived to [facility] on 09/09/24 seeking methamphetamine detox and was admitted to [facility] under IVC on 09/10/24 for severe substance-induced psychosis or mood disturbances. Patient has no significant past medical history.  On initial evaluation, patient reports having been recently arrested for trespassing at his children's mother's home. States she refuses to allow him to see their children, and he has not seen them in 3-4 months. Patient currently lives with his mother, however was living with his children's mother prior to being kicked out of her home. States this is not the first time he has been arrested for trespassing at her home, but views this as the only way to try to see his children. 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 Additionally, he mentions that she sold his RV and car, which he had been using for transportation and living purposes.   Patient states he was brought to [facility] by his brother, who gave him an ultimatum to either go to an inpatient psychiatric hospital, or his brother would not help pay his legal bills or help him secure a clinical research associate.       Patient denies a history of anxiety, depression, psychosis, or mania. He also denies any past psychiatric history except for a hospitalization in 2010 related to substance use. Patient endorses a history of methamphetamine  use, with his most recent use occurring a few days ago. His UDS on admission was positive for methamphetamines and marijuana. Although he is unsure of his average usage, he states he does not use meth daily and does not consider his use at the level of a meth head.  Per IVC paperwork, there was concern patient had been making homicidal threats telling someone who was not visible to kill an unknown party, and patient's brother suspects he has voiced homicidal threats towards patient's mother. Patient gives permission to contact his brother [name].   Past Psychiatric History: Patient denies previous psychiatric history. Denies taking psychiatric medications, previous hospitalizations, suicide attempts or self-injurious behavior.  Substance Abuse History: Reports using methamphetamine a few days ago. Reports he uses methamphetamine to help when he feels sad about not being able to see his children. UDS was also positive for marijuana.Reports past use of cocaine and acid.  Past Medical History: Patient denies significant past medical history.  Social History: Patient has a living with his mother for the last 3 months. Prior to living with his mom, he was living with his children's mother.  He has 2 children (ages 22 and 54). 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 Patient has been arrested several times for trespassing at his child's mother's home.  Family Psychiatric History: Denies  Columbia Scale:  Flowsheet Row [facility] (Current[facility] Most recent reading at 09/10/2024  7:00 PM ED from 09/09/2024 in [facility] at [facility] Most recent reading at 09/09/2024 11:41 PM ED from 09/09/2024 in [facility] Most recent reading at  09/09/2024  1:48 PM  C-SSRS RISK CATEGORY No Risk No Risk No Risk     Tobacco Screening:  Social History   Tobacco Use  Smoking  Status Never  Smokeless Tobacco Never    BH Tobacco Counseling     Are you interested in Tobacco Cessation Medications?  No, patient refused Counseled patient on smoking cessation:  Refused/Declined practical counseling Reason Tobacco Screening Not Completed: Patient Refused Screening    Allergies:   Allergies  Allergen Reactions   Other     Pt states as a child he took a pain medicine and it made him sick. Cannot remember what med or class it was.     OBJECTIVE:  Physical Examination:  Physical Exam Vitals and nursing note reviewed.  Constitutional:      General: He is not in acute distress.    Appearance: Normal appearance. He is normal weight. He is not ill-appearing.  HENT:     Head: Normocephalic and atraumatic.  Pulmonary:     Effort: Pulmonary effort is normal. No respiratory distress.  Neurological:     General: No focal deficit present.     Mental Status: He is alert and oriented to person, place, and time. Mental status is at baseline.    Review of Systems  Gastrointestinal:  Negative for abdominal pain, constipation, diarrhea, nausea and vomiting.  Neurological:  Negative for dizziness and headaches.  All other systems reviewed and are negative.  Blood pressure 114/72, pulse 65, temperature 98.2 F (36.8 C), temperature source Oral, resp. rate 14, height 5' 7 (1.702 m), weight 60.3 kg, SpO2 100%. Body mass index is 20.83 kg/m.  Metabolic disorder labs:  Lab Results  Component Value Date   HGBA1C 5.6 09/09/2024   MPG 114 09/09/2024   No results found for: PROLACTIN No results found for: CHOL, TRIG, HDL, CHOLHDL, VLDL, LDLCALC  Results for orders placed or performed during the hospital encounter of 09/09/24 (from the past 48 hours)  Ethanol     Status: None   Collection Time: 09/09/24 11:36 PM  Result Value Ref Range   Alcohol, Ethyl (B) <15 <15 mg/dL    Comment: (NOTE) For medical purposes only. Performed at [facility],  [address] [place], [place] [address]   CBC with Differential     Status: None   Collection Time: 09/09/24 11:42 PM  Result Value Ref Range   WBC 6.0 4.0 - 10.5 K/uL   RBC 4.37 4.22 - 5.81 MIL/uL   Hemoglobin 13.4 13.0 - 17.0 g/dL   HCT 59.7 60.9 - 47.9 %   MCV 92.0 80.0 - 100.0 fL   MCH 30.7 26.0 - 34.0 pg   MCHC 33.3 30.0 - 36.0 g/dL   RDW 87.1 88.4 - 84.4 %   Platelets 242 150 - 400 K/uL   nRBC 0.0 0.0 - 0.2 %   Neutrophils Relative % 46 %   Neutro Abs 2.8 1.7 - 7.7 K/uL   Lymphocytes Relative 39 %   Lymphs Abs 2.4 0.7 - 4.0 K/uL   Monocytes Relative 9 %   Monocytes Absolute 0.6 0.1 - 1.0 K/uL   Eosinophils Relative 5 %   Eosinophils Absolute 0.3 0.0 - 0.5 K/uL   Basophils Relative 1 %   Basophils Absolute 0.0 0.0 - 0.1 K/uL   Immature Granulocytes 0 %   Abs Immature Granulocytes 0.01 0.00 - 0.07 K/uL    Comment: Performed at [facility], [address] [place], [place] [address]  Comprehensive metabolic panel     Status: Abnormal   Collection Time: 09/09/24 11:42 PM  Result Value Ref Range   Sodium 138 135 - 145 mmol/L   Potassium 3.5 3.5 - 5.1 mmol/L   Chloride 102 98 - 111 mmol/L   CO2 26 22 - 32 mmol/L   Glucose, Bld 126 (H) 70 - 99 mg/dL    Comment: Glucose reference range applies only to samples taken after fasting for at least 8 hours.   BUN 14 6 - 20 mg/dL   Creatinine, Ser 8.73 (H) 0.61 - 1.24 mg/dL   Calcium 8.8 (L) 8.9 - 10.3 mg/dL   Total Protein 6.0 (L) 6.5 - 8.1 g/dL   Albumin 3.5 3.5 - 5.0 g/dL   AST 17 15 - 41 U/L   ALT 12 0 - 44 U/L   Alkaline Phosphatase 35 (L) 38 - 126 U/L   Total Bilirubin 0.6 0.0 - 1.2 mg/dL   GFR, Estimated >39 >39 mL/min    Comment: (NOTE) Calculated using the CKD-EPI Creatinine Equation (2021)    Anion gap 10 5 - 15    Comment: Performed at [facility], [address] [place], [place] [address]  Magnesium      Status: None   Collection Time: 09/09/24 11:42 PM  Result Value Ref Range   Magnesium  1.9 1.7  - 2.4 mg/dL    Comment: Performed at [facility], [address] [place], [place] [address]  CBG monitoring, ED     Status: Abnormal   Collection Time: 09/10/24 12:38 AM  Result Value Ref Range   Glucose-Capillary 116 (H) 70 - 99 mg/dL    Comment: Glucose reference range applies only to samples taken after fasting for at least 8 hours.  CK     Status: None   Collection Time: 09/10/24  4:56 AM  Result Value Ref Range   Total CK 90 49 - 397 U/L    Comment: Performed at [facility], [address] [place], [place] [address]  Acetaminophen  level     Status: Abnormal   Collection Time: 09/10/24  4:56 AM  Result Value Ref Range   Acetaminophen  (Tylenol ), Serum <10 (L) 10 - 30 ug/mL    Comment: (NOTE) Therapeutic concentrations vary significantly. A range of 10-30 ug/mL  may be an effective concentration for many patients. However, some  are best treated at concentrations outside of this range. Acetaminophen  concentrations >150 ug/mL at 4 hours after ingestion  and >50 ug/mL at 12 hours after ingestion are often associated with  toxic reactions.  Performed at [facility], [address] [place], [place] [address]   Salicylate level     Status: Abnormal   Collection Time: 09/10/24  4:56 AM  Result Value Ref Range   Salicylate Lvl <7.0 (L) 7.0 - 30.0 mg/dL    Comment: Performed at [facility], [address], [place], [place] [address]  I-Stat CG4 Lactic Acid     Status: None   Collection Time: 09/10/24  5:06 AM  Result Value Ref Range   Lactic Acid, Venous 1.5 0.5 - 1.9 mmol/L  Rapid urine drug screen (hospital performed)     Status: Abnormal   Collection Time: 09/10/24  6:00 AM  Result Value Ref Range   Opiates NONE DETECTED NONE DETECTED   Cocaine NONE DETECTED NONE DETECTED   Benzodiazepines NONE DETECTED NONE DETECTED   Amphetamines POSITIVE (A) NONE DETECTED    Comment: (NOTE)  Trazodone  is metabolized in vivo to several metabolites,  including pharmacologically active m-CPP, which is excreted in the urine. Immunoassay screens for amphetamines and MDMA have potential cross-reactivity with these compounds and may provide false positive  results.     Tetrahydrocannabinol NONE DETECTED NONE DETECTED   Barbiturates NONE DETECTED NONE DETECTED    Comment: (NOTE) DRUG SCREEN FOR MEDICAL PURPOSES ONLY.  IF CONFIRMATION IS NEEDED FOR ANY PURPOSE, NOTIFY LAB WITHIN 5 DAYS.  LOWEST DETECTABLE LIMITS FOR URINE DRUG SCREEN Drug Class                     Cutoff (ng/mL) Amphetamine  and metabolites    1000 Barbiturate and metabolites    200 Benzodiazepine                 200 Opiates and metabolites        300 Cocaine and metabolites        300 THC                            50 Performed at [facility], [address] [place], [place] [address]     Blood alcohol level:  Lab Results  Component Value Date   [facility] <15 09/09/2024   ETH <15 09/09/2024    Current Medications: Current Facility-Administered Medications  Medication Dose Route Frequency Provider Last Rate Last Admin   acetaminophen  (TYLENOL ) tablet 650 mg  650 mg Oral Q6H PRN Mannie Jerel PARAS, NP   650 mg at 09/11/24 0303   alum & mag hydroxide-simeth (MAALOX/MYLANTA) 200-200-20 MG/5ML suspension 30 mL  30 mL Oral Q4H PRN Mannie Jerel PARAS, NP       haloperidol  (HALDOL ) tablet 5 mg  5 mg Oral TID PRN Mannie Jerel PARAS, NP       And   diphenhydrAMINE  (BENADRYL ) capsule 50 mg  50 mg Oral TID PRN Mannie Jerel PARAS, NP       haloperidol  lactate (HALDOL ) injection 10 mg  10 mg Intramuscular TID PRN Mannie Jerel PARAS, NP       And   diphenhydrAMINE  (BENADRYL ) injection 50 mg  50 mg Intramuscular TID PRN Mannie Jerel PARAS, NP       And   LORazepam  (ATIVAN ) injection 2 mg  2 mg Intramuscular TID PRN Mannie Jerel PARAS, NP       haloperidol  lactate (HALDOL ) injection 5 mg  5 mg Intramuscular TID PRN Mannie Jerel PARAS, NP       And   diphenhydrAMINE  (BENADRYL )  injection 50 mg  50 mg Intramuscular TID PRN Mannie Jerel PARAS, NP       And   LORazepam  (ATIVAN ) injection 2 mg  2 mg Intramuscular TID PRN Mannie Jerel PARAS, NP       magnesium  hydroxide (MILK OF MAGNESIA) suspension 30 mL  30 mL Oral Daily PRN Mannie Jerel PARAS, NP       nicotine  (NICODERM CQ  - dosed in mg/24 hours) patch 14 mg  14 mg Transdermal Daily Prentis Kitchens A, DO   14 mg at 09/11/24 0853    PTA Medications: No medications prior to admission.    Mental Status Exam:  Appearance: Black male wearing paper scrubs, fairly groomed  Behavior: Cooperative, poor eye contact  Attitude: Guarded, paranoid  Speech: Normal rate, rhythm, tone  Mood: Mildly irritable  Affect: Congruent  Thought Process: Linear, logical, goal directed  Thought Content: No delusional thought content endorsed.  SI/HI: Denies  Perceptions: Denies AVH.  Not observed responding to internal stimuli.  Judgement: Poor  Insight: Poor  Fund of Knowledge: WNL    ASSESSMENT: Patient is a 38 year old male with a history of polysubstance use presenting involuntarily after recent arrest for trespassing at his children's mother's home. He was reported to be responding to internal stimuli and making homicidal threats. He was brought to [facility] by his brother after an ultimatum was given for inpatient psychiatric care. On interview, patient appears guarded and somewhat paranoid, providing brief responses to several questions and staring bizarrely. Given patient's history of methamphetamine use and lack of significant prior psychiatric history beyond substance use, his presentation appears most consistent with substance-induced psychosis. Will start Zyprexa  7.5 mg twice daily for psychosis.   PLAN:  Psychiatric Diagnoses and Treatment  # Substance-induced psychosis - Start Zyprexa  7.5 mg BID  PRN's - Trazodone  50 mg at bedtime as needed for insomnia - Atarax  25 mg TID as needed for anxiety - Agitation Protocol: haldol  +  ativan  + benadryl   2. Active Medical Issues  #Nicotine  withdrawal - Patient in need of nicotine  replacement; nicotine  patch 14 mg / 24 hours ordered. Smoking cessation encouraged  Other as needed medications  Tylenol  650 mg every 6 hours as needed for pain Mylanta 30 mL every 4 hours as needed for indigestion Milk of magnesia 30 mL daily as needed for constipation  The risks/benefits/side-effects/alternatives to the above medication(s) were discussed in detail with the patient and time was given for questions. The patient consents to medication trial. FDA black box warnings, if present, were discussed.  3. Safety and Monitoring: - Involuntary admission to inpatient psychiatric unit for safety, stabilization and treatment - Daily contact with patient to assess and evaluate symptoms and progress in treatment - Patient's case to be discussed in multi-disciplinary team meeting - Observation Level: q15 minute checks - Vital signs:  q12 hours - Precautions: suicide, elopement, and assault  4. Routine and other pertinent labs: EKG monitoring: QTc: 438  Metabolism / endocrine: BMI: Body mass index is 20.83 kg/m.  CBC: unremarkable CMP: Cr 1.26, calcium 8.8 UDS: positive for amphetamines, marijuana Ethanol: <10 TSH: WNL A1c: 5.6 Lipid panel: N/A  5.   Group Therapy: - Encouraged patient to participate in unit milieu and in scheduled group therapies  - Short Term Goals: Ability to demonstrate self-control will improve and Ability to identify triggers associated with substance abuse/mental health issues will improve - Long Term Goals: Improvement in symptoms so as ready for discharge - Patient is encouraged to participate in group therapy while admitted to the psychiatric unit. - We will address other chronic and acute stressors, which contributed to the patient's Psychoactive substance-induced psychosis (HCC) in order to reduce the risk of self-harm at discharge.  7.   Discharge  Planning:  - Social work and case management to assist with discharge planning and identification of hospital follow-up needs prior to discharge - Estimated LOS: 5-7 days - Discharge Concerns: Need to establish a safety plan; Medication compliance and effectiveness - Discharge Goals: Return home with outpatient referrals for mental health follow-up including medication management/psychotherapy  I certify that inpatient services furnished can reasonably be expected to improve the patient's condition.      Ashley LOISE Gravely, MD PGY-1, Psychiatry Residency  11/29/20253:01 PM

## 2024-09-11 NOTE — Group Note (Signed)
 Date:  09/11/2024 Time:  8:45 PM  Group Topic/Focus:  Wrap-Up Group:   The focus of this group is to help patients review their daily goal of treatment and discuss progress on daily workbooks.    Participation Level:  Did Not Attend  Brad Walters 09/11/2024, 8:45 PM

## 2024-09-11 NOTE — Group Note (Unsigned)
 Date:  09/11/2024 Time:  6:29 PM  Group Topic/Focus:  Music Therapy     Participation Level:  {BHH PARTICIPATION OZCZO:77735}  Participation Quality:  {BHH PARTICIPATION QUALITY:22265}  Affect:  {BHH AFFECT:22266}  Cognitive:  {BHH COGNITIVE:22267}  Insight: {BHH Insight2:20797}  Engagement in Group:  {BHH ENGAGEMENT IN HMNLE:77731}  Modes of Intervention:  {BHH MODES OF INTERVENTION:22269}  Additional Comments:  ***  Brad Walters 09/11/2024, 6:29 PM

## 2024-09-11 NOTE — BHH Group Notes (Signed)
 Adult Psychoeducational Group Note  Date:  09/11/2024 Time:  3:16 PM  Group Topic/Focus: Group Notes Wellness Toolbox:   The focus of this group is to discuss various aspects of wellness, balancing those aspects and exploring ways to increase the ability to experience wellness.  Patients will create a wellness toolbox for use upon discharge.  Participation Level:  Did Not Attend  Participation Quality:    Affect:    Cognitive:    Insight:   Engagement in Group:    Modes of Intervention:    Additional Comments:    Annett Berle Hoyer 09/11/2024, 3:16 PM

## 2024-09-11 NOTE — [facility] Suicide Risk Assessment (Signed)
 Suicide Risk Assessment  Admission Assessment    [facility] Admission Suicide Risk Assessment   Nursing information obtained from:  Patient Demographic factors:  Male Current Mental Status:  NA Loss Factors:  NA Historical Factors:  NA Risk Reduction Factors:  NA  Principal Problem: Psychoactive substance-induced psychosis (HCC) Diagnosis:  Principal Problem:   Psychoactive substance-induced psychosis (HCC)  Subjective Data: Brad Walters is a 38 y.o. male  with a past psychiatric history of polysubstance use. Patient initially arrived to [facility] on 09/09/24 seeking methamphetamine detox and was admitted to [facility] under IVC on 09/10/24 for severe substance-induced psychosis or mood disturbances. Patient has no significant past medical history.   On initial evaluation, patient reports having been recently arrested for trespassing at his children's mother's home. States she refuses to allow him to see their children, and he has not seen them in 3-4 months. Patient currently lives with his mother, however was living with his children's mother prior to being kicked out of her home. States this is not the first time he has been arrested for trespassing at her home, but views this as the only way to try to see his children. 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 Additionally, he mentions that she sold his RV and car, which he had been using for transportation and living purposes.    Patient states he was brought to [facility] by his brother, who gave him an ultimatum to either go to an inpatient psychiatric hospital, or his brother would not help pay his legal bills or help him secure a clinical research associate.       Patient denies a history of anxiety, depression, psychosis, or mania. He also denies any past psychiatric history except for a hospitalization in 2010 related to substance use.  Patient endorses a history of methamphetamine use, with his most recent use occurring a few days ago. His UDS on admission was positive for methamphetamines and marijuana. Although he is unsure of his average usage, he states he does not use meth daily and does not consider his use at the level of a meth head.   Per IVC paperwork, there was concern patient had been making homicidal threats telling someone who was not visible to kill an unknown party, and patient's brother suspects he has voiced homicidal threats towards patient's mother. Patient gives permission to contact his brother [name].   Continued Clinical Symptoms:  Alcohol Use Disorder Identification Test Final Score (AUDIT): 0 The Alcohol Use Disorders Identification Test, Guidelines for Use in Primary Care, Second Edition.  [employer] [facility]). Score between 0-7:  no or low risk or alcohol related problems. Score between 8-15:  moderate risk of alcohol related problems. Score between 16-19:  high risk of alcohol related problems. Score 20 or above:  warrants further diagnostic evaluation for alcohol dependence and treatment.   CLINICAL FACTORS:   Alcohol/Substance Abuse/Dependencies Currently Psychotic   Musculoskeletal: Strength & Muscle Tone: within normal limits Gait & Station: normal Patient leans: N/A  Psychiatric Specialty Exam:  Presentation  General Appearance:  Appropriate for Environment; Fairly Groomed  Eye Contact: Poor  Speech: Normal Rate; Clear and Coherent  Speech Volume: Normal  Mood and Affect  Mood: Irritable  Affect: Congruent; Constricted   Thought Process  Thought Processes: Coherent; Goal Directed  Descriptions of Associations:Intact  Orientation:Full (Time, Place and Person)  Thought Content:Paranoid Ideation  History  of Schizophrenia/Schizoaffective disorder:No  Duration of Psychotic Symptoms:N/A  Hallucinations:Hallucinations: None  Ideas of  Reference:None  Suicidal Thoughts:Suicidal Thoughts: No  Homicidal Thoughts:Homicidal Thoughts: No   Sensorium  Memory: Immediate Good; Recent Good  Judgment: Poor  Insight: Shallow   Executive Functions  Concentration: Fair  Attention Span: Fair  Recall: Fair  Fund of Knowledge: Good  Language: Good   Psychomotor Activity  Psychomotor Activity: Psychomotor Activity: Normal   Assets  Assets: Physical Health   Sleep  Sleep: Sleep: Fair    Physical Exam: Physical Exam Vitals and nursing note reviewed.  Constitutional:      General: He is not in acute distress.    Appearance: Normal appearance. He is normal weight. He is not ill-appearing.  HENT:     Head: Normocephalic and atraumatic.  Pulmonary:     Effort: Pulmonary effort is normal. No respiratory distress.  Neurological:     General: No focal deficit present.     Mental Status: He is alert and oriented to person, place, and time. Mental status is at baseline.    Review of Systems  Gastrointestinal:  Negative for abdominal pain, constipation, diarrhea, nausea and vomiting.  Neurological:  Negative for dizziness and headaches.  All other systems reviewed and are negative.  Blood pressure 114/72, pulse 65, temperature 98.2 F (36.8 C), temperature source Oral, resp. rate 14, height 5' 7 (1.702 m), weight 60.3 kg, SpO2 100%. Body mass index is 20.83 kg/m.   COGNITIVE FEATURES THAT CONTRIBUTE TO RISK:  None    SUICIDE RISK:  Minimal: No identifiable suicidal ideation.  Patients presenting with no risk factors but with morbid ruminations; may be classified as minimal risk based on the severity of the depressive symptoms  PLAN OF CARE: Psychiatric Diagnoses and Treatment   # Substance-induced psychosis - Start Zyprexa  7.5 mg BID   PRN's - Trazodone  50 mg at bedtime as needed for insomnia - Atarax  25 mg TID as needed for anxiety - Agitation Protocol: haldol  + ativan  + benadryl     2. Active Medical Issues   #Nicotine  withdrawal - Patient in need of nicotine  replacement; nicotine  patch 14 mg / 24 hours ordered. Smoking cessation encouraged   Other as needed medications  Tylenol  650 mg every 6 hours as needed for pain Mylanta 30 mL every 4 hours as needed for indigestion Milk of magnesia 30 mL daily as needed for constipation   The risks/benefits/side-effects/alternatives to the above medication(s) were discussed in detail with the patient and time was given for questions. The patient consents to medication trial. FDA black box warnings, if present, were discussed.   3. Safety and Monitoring: - Involuntary admission to inpatient psychiatric unit for safety, stabilization and treatment - Daily contact with patient to assess and evaluate symptoms and progress in treatment - Patient's case to be discussed in multi-disciplinary team meeting - Observation Level: q15 minute checks - Vital signs:  q12 hours - Precautions: suicide, elopement, and assault   4. Routine and other pertinent labs: EKG monitoring: QTc: 438   Metabolism / endocrine: BMI: Body mass index is 20.83 kg/m.   CBC: unremarkable CMP: Cr 1.26, calcium 8.8 UDS: positive for amphetamines, marijuana Ethanol: <10 TSH: WNL A1c: 5.6 Lipid panel: N/A   5.   Group Therapy: - Encouraged patient to participate in unit milieu and in scheduled group therapies  - Short Term Goals: Ability to demonstrate self-control will improve and Ability to identify triggers associated with substance abuse/mental health issues will improve - Long Term  Goals: Improvement in symptoms so as ready for discharge - Patient is encouraged to participate in group therapy while admitted to the psychiatric unit. - We will address other chronic and acute stressors, which contributed to the patient's Psychoactive substance-induced psychosis (HCC) in order to reduce the risk of self-harm at discharge.   7.   Discharge Planning:  -  Social work and case management to assist with discharge planning and identification of hospital follow-up needs prior to discharge - Estimated LOS: 5-7 days - Discharge Concerns: Need to establish a safety plan; Medication compliance and effectiveness - Discharge Goals: Return home with outpatient referrals for mental health follow-up including medication management/psychotherapy  I certify that inpatient services furnished can reasonably be expected to improve the patient's condition.   Ashley LOISE Gravely, MD 09/11/2024, 3:59 PM

## 2024-09-11 NOTE — Group Note (Unsigned)
 Date:  09/11/2024 Time:  6:30 PM  Group Topic/Focus:  Music therapy      Participation Level:  {BHH PARTICIPATION OZCZO:77735}  Participation Quality:  {B[facility] PARTICIPATION QUALITY:22265}  Affect:  {BHH AFFECT:22266}  Cognitive:  {B[facility] COGNITIVE:22267}  Insight: {BHH Insight2:20797}  Engagement in Group:  {B[facility] ENGAGEMENT IN HMNLE:77731}  Modes of Intervention:  {BHH MODES OF INTERVENTION:22269}  Additional Comments:  ***  Brad Walters 09/11/2024, 6:30 PM

## 2024-09-12 NOTE — Plan of Care (Signed)
  Problem: Education: Goal: Knowledge of [facility] Education information/materials will improve Outcome: Progressing Goal: Emotional status will improve Outcome: Progressing Goal: Mental status will improve Outcome: Progressing Goal: Verbalization of understanding the information provided will improve Outcome: Progressing   Problem: Activity: Goal: Interest or engagement in activities will improve Outcome: Progressing Goal: Sleeping patterns will improve Outcome: Progressing   Problem: Coping: Goal: Ability to verbalize frustrations and anger appropriately will improve Outcome: Progressing Goal: Ability to demonstrate self-control will improve Outcome: Progressing   Problem: Health Behavior/Discharge Planning: Goal: Identification of resources available to assist in meeting health care needs will improve Outcome: Progressing Goal: Compliance with treatment plan for underlying cause of condition will improve Outcome: Progressing   Problem: Physical Regulation: Goal: Ability to maintain clinical measurements within normal limits will improve Outcome: Progressing   Problem: Safety: Goal: Periods of time without injury will increase Outcome: Progressing   Problem: Education: Goal: Knowledge of [facility] Education information/materials will improve Outcome: Progressing Goal: Emotional status will improve Outcome: Progressing Goal: Mental status will improve Outcome: Progressing Goal: Verbalization of understanding the information provided will improve Outcome: Progressing   Problem: Education: Goal: Will be free of psychotic symptoms Outcome: Progressing Goal: Knowledge of the prescribed therapeutic regimen will improve Outcome: Progressing   Problem: Coping: Goal: Coping ability will improve Outcome: Progressing Goal: Will verbalize feelings Outcome: Progressing   Problem: Health Behavior/Discharge Planning: Goal: Compliance with prescribed  medication regimen will improve Outcome: Progressing   Problem: Activity: Goal: Will identify at least one activity in which they can participate Outcome: Progressing   Problem: Coping: Goal: Ability to identify and develop effective coping behavior will improve Outcome: Progressing Goal: Ability to interact with others will improve Outcome: Progressing Goal: Demonstration of participation in decision-making regarding own care will improve Outcome: Progressing Goal: Ability to use eye contact when communicating with others will improve Outcome: Progressing   Problem: Health Behavior/Discharge Planning: Goal: Identification of resources available to assist in meeting health care needs will improve Outcome: Progressing   Problem: Self-Concept: Goal: Will verbalize positive feelings about self Outcome: Progressing   Problem: Health Behavior/Discharge Planning: Goal: Ability to identify changes in lifestyle to reduce recurrence of condition will improve Outcome: Progressing Goal: Identification of resources available to assist in meeting health care needs will improve Outcome: Progressing   Problem: Physical Regulation: Goal: Complications related to the disease process, condition or treatment will be avoided or minimized Outcome: Progressing   Problem: Safety: Goal: Ability to remain free from injury will improve Outcome: Progressing

## 2024-09-12 NOTE — Progress Notes (Signed)
 [facility] Inpatient Psychiatry Progress Note  Date: 09/12/2024 Patient: Brad Walters MRN: [id number]   ASSESSMENT: Patient is a 38 year old male with a history of polysubstance use presenting involuntarily after recent arrest for trespassing at his children's mother's home. He was reported to be responding to internal stimuli and making homicidal threats. He was brought to [facility] by his brother after an ultimatum was given for inpatient psychiatric care. Given patient's history of methamphetamine use and lack of significant prior psychiatric history beyond substance use, his presentation appears most consistent with substance-induced psychosis. However, still need to rule out primary psychiatric disorder.  11/30: No significant changes from yesterday. Patient continues to appear guarded and paranoid, and very cautious of information he volunteers to treatment team. Patient taking medications without issue. Spoke with patient's brother, who states if patient has nowhere to go at discharge, he will let him stay at his home.  PLAN:  Psychiatric Diagnoses and Treatment   # Substance-induced psychosis - Zyprexa  7.5 mg BID   PRN's - Trazodone  50 mg at bedtime as needed for insomnia - Atarax  25 mg TID as needed for anxiety - Agitation Protocol: haldol  + ativan  + benadryl    2. Active Medical Issues   #Nicotine  withdrawal - Patient in need of nicotine  replacement; nicotine  patch 14 mg / 24 hours ordered. Smoking cessation encouraged   Other as needed medications  Tylenol  650 mg every 6 hours as needed for pain Mylanta 30 mL every 4 hours as needed for indigestion Milk of magnesia 30 mL daily as needed for constipation   Risk Assessment: Patient continues to require inpatient hospitalization for safety and stabilization of acute psychosis and paranoia.  Discharge Planning: Barriers to Discharge: acute psychosis, paranoia Estimated Length of Stay: 5-7  days Predicted Discharge Location: TBD  INTERVAL HISTORY: Chart reviewed. No significant events overnight. Per chart review, patient noted to be guarded and isolative to room yesterday despite multiple prompts to attend groups. On assessment today, patient denies auditory or visual hallucinations. He denies suicidal or homicidal ideation. He appears guarded, answering in brief yes or no responses without much elaboration. He denies medication side effects.  Collateral from [name] (brother) at [phone]: Spoke with patient's brother, [name], who reports he asked patient to check himself into hospital after noticing that patient wasn't sleeping and was up at night talking to himself repeatedly about the same topics. Patient often perseverates about his ex-girlfriend selling his car, house, and taking his business from him. 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 Patient was introduced to meth and other substances during road trips for this business. Brother says patient had no mental health issues prior to using meth, and he has only noticed patient acting bizarrely and talking to himself when he is using substances.   Brother states patient's girlfriend has a legal protection order against patient. Patient has been struggling and depressed with not being able to see his children, and was arrested multiple times for trespassing on her property to try to see his children. Brother states he made an agreement for patient to go into hospital in exchange for helping patient obtain a lawyer to fight for custody agreement or supervised visits to see his children. Brother states patient made threats toward their mom. He says mom was so scared, she locked herself in her car. Mom says she heard patient talking to his son who wasn't actually there, and saying kill them. See what they  did to your dad? Patient was living with mom prior  to admission, but mom says he is no longer welcome at her home. Brother says if patient has nowhere else to go, he can take him in after discharge.  PRN's in the last 24 hours include none.  Physical Examination:  Vitals and nursing note reviewed MSK: Normal gait and station  MENTAL STATUS EXAM:  Appearance: Black male wearing paper scrubs, fairly groomed  Behavior: Cooperative, poor eye contact  Attitude: Guarded, paranoid  Speech: Normal rate, rhythm, tone  Mood: Fine  Affect: Congruent  Thought Process: Linear, logical, goal directed  Thought Content: No delusional thought content endorsed.  SI/HI: Denies  Perceptions: Denies AVH. Not observed responding to internal stimuli.  Judgement: Poor  Insight: Poor  Fund of Knowledge: WNL   Lab Results:  No visits with results within 1 Day(s) from this visit.  Latest known visit with results is:  Admission on 09/09/2024, Discharged on 09/10/2024  Component Date Value Ref Range Status   WBC 09/09/2024 6.0  4.0 - 10.5 K/uL Final   RBC 09/09/2024 4.37  4.22 - 5.81 MIL/uL Final   Hemoglobin 09/09/2024 13.4  13.0 - 17.0 g/dL Final   HCT 88/72/7974 40.2  39.0 - 52.0 % Final   MCV 09/09/2024 92.0  80.0 - 100.0 fL Final   MCH 09/09/2024 30.7  26.0 - 34.0 pg Final   MCHC 09/09/2024 33.3  30.0 - 36.0 g/dL Final   RDW 88/72/7974 12.8  11.5 - 15.5 % Final   Platelets 09/09/2024 242  150 - 400 K/uL Final   nRBC 09/09/2024 0.0  0.0 - 0.2 % Final   Neutrophils Relative % 09/09/2024 46  % Final   Neutro Abs 09/09/2024 2.8  1.7 - 7.7 K/uL Final   Lymphocytes Relative 09/09/2024 39  % Final   Lymphs Abs 09/09/2024 2.4  0.7 - 4.0 K/uL Final   Monocytes Relative 09/09/2024 9  % Final   Monocytes Absolute 09/09/2024 0.6  0.1 - 1.0 K/uL Final   Eosinophils Relative 09/09/2024 5  % Final   Eosinophils Absolute 09/09/2024 0.3  0.0 - 0.5 K/uL Final   Basophils Relative 09/09/2024 1  % Final   Basophils Absolute 09/09/2024 0.0  0.0 - 0.1 K/uL Final    Immature Granulocytes 09/09/2024 0  % Final   Abs Immature Granulocytes 09/09/2024 0.01  0.00 - 0.07 K/uL Final   Sodium 09/09/2024 138  135 - 145 mmol/L Final   Potassium 09/09/2024 3.5  3.5 - 5.1 mmol/L Final   Chloride 09/09/2024 102  98 - 111 mmol/L Final   CO2 09/09/2024 26  22 - 32 mmol/L Final   Glucose, Bld 09/09/2024 126 (H)  70 - 99 mg/dL Final   BUN 88/72/7974 14  6 - 20 mg/dL Final   Creatinine, Ser 09/09/2024 1.26 (H)  0.61 - 1.24 mg/dL Final   Calcium 88/72/7974 8.8 (L)  8.9 - 10.3 mg/dL Final   Total Protein 88/72/7974 6.0 (L)  6.5 - 8.1 g/dL Final   Albumin 88/72/7974 3.5  3.5 - 5.0 g/dL Final   AST 88/72/7974 17  15 - 41 U/L Final   ALT 09/09/2024 12  0 - 44 U/L Final   Alkaline Phosphatase 09/09/2024 35 (L)  38 - 126 U/L Final   Total Bilirubin 09/09/2024 0.6  0.0 - 1.2 mg/dL Final   GFR, Estimated 09/09/2024 >60  >60 mL/min Final   Anion gap 09/09/2024 10  5 - 15 Final   Magnesium   09/09/2024 1.9  1.7 - 2.4 mg/dL Final   Glucose-Capillary 09/10/2024 116 (H)  70 - 99 mg/dL Final   Opiates 88/71/7974 NONE DETECTED  NONE DETECTED Final   Cocaine 09/10/2024 NONE DETECTED  NONE DETECTED Final   Benzodiazepines 09/10/2024 NONE DETECTED  NONE DETECTED Final   Amphetamines 09/10/2024 POSITIVE (A)  NONE DETECTED Final   Tetrahydrocannabinol 09/10/2024 NONE DETECTED  NONE DETECTED Final   Barbiturates 09/10/2024 NONE DETECTED  NONE DETECTED Final   Alcohol, Ethyl (B) 09/09/2024 <15  <15 mg/dL Final   Lactic Acid, Venous 09/10/2024 1.5  0.5 - 1.9 mmol/L Final   Total CK 09/10/2024 90  49 - 397 U/L Final   Acetaminophen  (Tylenol ), Serum 09/10/2024 <10 (L)  10 - 30 ug/mL Final   Salicylate Lvl 09/10/2024 <7.0 (L)  7.0 - 30.0 mg/dL Final     Vitals: Blood pressure 101/67, pulse (!) 56, temperature 97.7 F (36.5 C), temperature source Oral, resp. rate 14, height 5' 7 (1.702 m), weight 60.3 kg, SpO2 99%.    Ashley Gravely, MD PGY-1, Psychiatry Residency  09/12/2024, 9:55  AM

## 2024-09-12 NOTE — B[facility] Group Notes (Signed)
 Adult Psychoeducational Group Note  Date:  09/12/2024 Time:  1:09 PM  Group Topic/Focus: Group Notes Wellness Toolbox:   The focus of this group is to discuss various aspects of wellness, balancing those aspects and exploring ways to increase the ability to experience wellness.  Patients will create a wellness toolbox for use upon discharge.  Participation Level:  Did Not Attend  Participation Quality:    Affect:    Cognitive:    Insight:   Engagement in Group:    Modes of Intervention:    Additional Comments:    Annett Berle Hoyer 09/12/2024, 1:09 PM

## 2024-09-12 NOTE — Progress Notes (Signed)
 Pt isolative to room. Group attendance poor. Pt does remain medication compliant. When asked if pt was experiencing feelings of paranoia pt told nurse that she made him paranoid by asking if he was paranoid and promptly returned to room.

## 2024-09-12 NOTE — [facility] Group Notes (Incomplete)
 Adult Psychoeducational Group Note  Date:  09/12/2024 Time:  10:24 AM  Group Topic/Focus: Group Notes Goals Group:   The focus of this group is to help patients establish daily goals to achieve during treatment and discuss how the patient can incorporate goal setting into their daily lives to aide in recovery.  Participation Level:  Did Not Attend  Participation Quality:    Affect:    Cognitive:    Insight: {BHH Insight2:20797}  Engagement in Group:  {BHH ENGAGEMENT IN HMNLE:77731}  Modes of Intervention:  {B[facility] MODES OF INTERVENTION:22269}  Additional Comments:  ***  Annett Redman Ramsay 09/12/2024, 10:24 AM

## 2024-09-12 NOTE — Progress Notes (Signed)
(  Sleep Hours) - 9 (Any PRNs that were needed, meds refused, or side effects to meds)-none (Any disturbances and when (visitation, over night) (Concerns raised by the patient)- none (SI/HI/AVH)-denies

## 2024-09-12 NOTE — Group Note (Signed)
 Date:  09/12/2024 Time:  4:36 PM  Group Topic/Focus:  Dimensions of Wellness:   The focus of this group is to introduce the topic of wellness and discuss the role each dimension of wellness plays in total health.  Patients worked together to financial risk analyst problem solving and manufacturing systems engineer.  Participation Level:  Did Not Attend  Participation Quality:    Affect:    Cognitive:    Insight:   Engagement in Group:    Modes of Intervention:    Additional Comments:    Asberry CROME Ashir Kunz 09/12/2024, 4:36 PM

## 2024-09-12 NOTE — Group Note (Signed)
 Date:  09/12/2024 Time:  8:40 PM  Group Topic/Focus:  Wrap-Up Group:   The focus of this group is to help patients review their daily goal of treatment and discuss progress on daily workbooks.    Participation Level:  Active  Participation Quality:  Appropriate  Affect:  Appropriate  Cognitive:  Appropriate  Insight: Appropriate  Engagement in Group:  Engaged  Modes of Intervention:  Education and Exploration  Additional Comments:  Patient attended and participated in group tonight. He reports that he did not make a goal for himself today.  Gwenn Chillington Dacosta 09/12/2024, 8:40 PM

## 2024-09-13 ENCOUNTER — Encounter (HOSPITAL_COMMUNITY): Payer: Self-pay

## 2024-09-13 NOTE — Progress Notes (Signed)
 [facility] Inpatient Psychiatry Progress Note  Date: 09/13/2024 Patient: Brad Walters MRN: [id number]  ASSESSMENT: Patient is a 38 year old male with a history of polysubstance use presenting involuntarily after recent arrest for trespassing at his children's mother's home. He was reported to be responding to internal stimuli and making homicidal threats. He was brought to [facility] by his brother after an ultimatum was given for inpatient psychiatric care. Given patient's history of methamphetamine use and lack of significant prior psychiatric history beyond substance use, his presentation appears most consistent with substance-induced psychosis. However, still need to rule out primary psychiatric disorder.  12/1: No significant changes from yesterday. Patient appears guarded and irritable, though does not seem to be responding to internal stimuli. Patient taking medications without issue.   PLAN: Psychiatric Diagnoses and Treatment # Substance-induced psychosis - Continue Zyprexa  7.5 mg BID   PRN's - Trazodone  50 mg at bedtime as needed for insomnia - Atarax  25 mg TID as needed for anxiety - Agitation Protocol: haldol  + ativan  + benadryl    2. Active Medical Issues #Nicotine  withdrawal - Patient in need of nicotine  replacement; nicotine  patch 14 mg / 24 hours ordered. Smoking cessation encouraged   Other as needed medications  Tylenol  650 mg every 6 hours as needed for pain Mylanta 30 mL every 4 hours as needed for indigestion Milk of magnesia 30 mL daily as needed for constipation  Risk Assessment: Patient continues to require inpatient hospitalization for safety and stabilization of acute psychosis and paranoia.  Discharge Planning: Barriers to Discharge: acute psychosis, paranoia Estimated Length of Stay: 1-3 days Predicted Discharge Location: TBD  INTERVAL HISTORY: Chart reviewed. No significant events overnight. Per chart review, patient noted to be  guarded and isolative to room yesterday despite multiple prompts to attend groups. On assessment today, patient denies auditory or visual hallucinations. He denies suicidal or homicidal ideation. He expresses discontent with physicians asking patient's if they are suicidal or homicidal, says it is not good for mental health.  Patient says he was never here for SI, and says that he was here for detox and he is currently detoxing.  Patient is not interested in residential rehabilitation, and would like to discharge home to his mother, though per chart review, this does not appear to be safe disposition.  Patient denies medication side effects or somatic complaints.  I spoke with [name] (brother) at [phone] to discuss discharge planning. [name] says he is agreeable with his brother staying in his home upon discharge, denies safety concerns and does not think it is a good idea for patient to go to his mother's house.   PRN's in the last 24 hours include none.  Physical Examination:  Vitals and nursing note reviewed MSK: Normal gait and station  MENTAL STATUS EXAM:  Appearance: Black male wearing paper scrubs  Behavior: Cooperative, poor eye contact  Attitude: Irritable  Speech: Normal rate, rhythm, tone  Mood: Fine  Affect: Congruent  Thought Process: Linear, logical, goal directed  Thought Content: No delusional thought content endorsed.  SI/HI: Denies  Perceptions: Denies AVH. Not observed responding to internal stimuli.  Judgement: Poor  Insight: Poor  Fund of Knowledge: WNL   Lab Results:  No visits with results within 1 Day(s) from this visit.  Latest known visit with results is:  Admission on 09/09/2024, Discharged on 09/10/2024  Component Date Value Ref Range Status   WBC 09/09/2024 6.0  4.0 - 10.5 K/uL Final   RBC 09/09/2024 4.37  4.22 -  5.81 MIL/uL Final   Hemoglobin 09/09/2024 13.4  13.0 - 17.0 g/dL Final   HCT 88/72/7974 40.2  39.0 - 52.0 % Final   MCV  09/09/2024 92.0  80.0 - 100.0 fL Final   MCH 09/09/2024 30.7  26.0 - 34.0 pg Final   MCHC 09/09/2024 33.3  30.0 - 36.0 g/dL Final   RDW 88/72/7974 12.8  11.5 - 15.5 % Final   Platelets 09/09/2024 242  150 - 400 K/uL Final   nRBC 09/09/2024 0.0  0.0 - 0.2 % Final   Neutrophils Relative % 09/09/2024 46  % Final   Neutro Abs 09/09/2024 2.8  1.7 - 7.7 K/uL Final   Lymphocytes Relative 09/09/2024 39  % Final   Lymphs Abs 09/09/2024 2.4  0.7 - 4.0 K/uL Final   Monocytes Relative 09/09/2024 9  % Final   Monocytes Absolute 09/09/2024 0.6  0.1 - 1.0 K/uL Final   Eosinophils Relative 09/09/2024 5  % Final   Eosinophils Absolute 09/09/2024 0.3  0.0 - 0.5 K/uL Final   Basophils Relative 09/09/2024 1  % Final   Basophils Absolute 09/09/2024 0.0  0.0 - 0.1 K/uL Final   Immature Granulocytes 09/09/2024 0  % Final   Abs Immature Granulocytes 09/09/2024 0.01  0.00 - 0.07 K/uL Final   Sodium 09/09/2024 138  135 - 145 mmol/L Final   Potassium 09/09/2024 3.5  3.5 - 5.1 mmol/L Final   Chloride 09/09/2024 102  98 - 111 mmol/L Final   CO2 09/09/2024 26  22 - 32 mmol/L Final   Glucose, Bld 09/09/2024 126 (H)  70 - 99 mg/dL Final   BUN 88/72/7974 14  6 - 20 mg/dL Final   Creatinine, Ser 09/09/2024 1.26 (H)  0.61 - 1.24 mg/dL Final   Calcium 88/72/7974 8.8 (L)  8.9 - 10.3 mg/dL Final   Total Protein 88/72/7974 6.0 (L)  6.5 - 8.1 g/dL Final   Albumin 88/72/7974 3.5  3.5 - 5.0 g/dL Final   AST 88/72/7974 17  15 - 41 U/L Final   ALT 09/09/2024 12  0 - 44 U/L Final   Alkaline Phosphatase 09/09/2024 35 (L)  38 - 126 U/L Final   Total Bilirubin 09/09/2024 0.6  0.0 - 1.2 mg/dL Final   GFR, Estimated 09/09/2024 >60  >60 mL/min Final   Anion gap 09/09/2024 10  5 - 15 Final   Magnesium  09/09/2024 1.9  1.7 - 2.4 mg/dL Final   Glucose-Capillary 09/10/2024 116 (H)  70 - 99 mg/dL Final   Opiates 88/71/7974 NONE DETECTED  NONE DETECTED Final   Cocaine 09/10/2024 NONE DETECTED  NONE DETECTED Final   Benzodiazepines  09/10/2024 NONE DETECTED  NONE DETECTED Final   Amphetamines 09/10/2024 POSITIVE (A)  NONE DETECTED Final   Tetrahydrocannabinol 09/10/2024 NONE DETECTED  NONE DETECTED Final   Barbiturates 09/10/2024 NONE DETECTED  NONE DETECTED Final   Alcohol, Ethyl (B) 09/09/2024 <15  <15 mg/dL Final   Lactic Acid, Venous 09/10/2024 1.5  0.5 - 1.9 mmol/L Final   Total CK 09/10/2024 90  49 - 397 U/L Final   Acetaminophen  (Tylenol ), Serum 09/10/2024 <10 (L)  10 - 30 ug/mL Final   Salicylate Lvl 09/10/2024 <7.0 (L)  7.0 - 30.0 mg/dL Final     Vitals: Blood pressure 98/67, pulse 60, temperature 98.3 F (36.8 C), temperature source Oral, resp. rate 14, height 5' 7 (1.702 m), weight 60.3 kg, SpO2 98%.   Alfornia Light, DO PGY-1, Psychiatry Residency  09/13/2024, 7:35 AM

## 2024-09-13 NOTE — Group Note (Signed)
 Recreation Therapy Group Note   Group Topic:Communication  Group Date: 09/13/2024 Start Time: 1030 End Time: 1105 Facilitators: Havard Radigan-McCall, LRT,CTRS Location: [address] Dayroom   Group Topic: Communication, Team Building, Problem Solving  Goal Area(s) Addresses:  Patient will effectively work with peer towards shared goal.  Patient will identify skills used to make activity successful.  Patient will identify how skills used during activity can be applied to reach post d/c goals.   Behavioral Response:   Intervention: STEM Activity- Glass Blower/designer  Activity: Tallest [employer]. In teams of 5-6, patients were given 11 craft pipe cleaners. Using the materials provided, patients were instructed to compete again the opposing team(s) to build the tallest free-standing structure from floor level. The activity was timed; difficulty increased by clinical research associate as production designer, theatre/television/film continued.  Systematically resources were removed with additional directions for example, placing one arm behind their back, working in silence, and shape stipulations. LRT facilitated post-activity discussion reviewing team processes and necessary communication skills involved in completion. Patients were encouraged to reflect how the skills utilized, or not utilized, in this activity can be incorporated to positively impact support systems post discharge.  Education: Pharmacist, Community, Scientist, Physiological, Discharge Planning   Education Outcome: Acknowledges education/In group clarification offered/Needs additional education.    Affect/Mood: N/A   Participation Level: Did not attend    Clinical Observations/Individualized Feedback:     Plan: Continue to engage patient in RT group sessions 2-3x/week.   Brad Walters, LRT,CTRS  09/13/2024 1:17 PM

## 2024-09-13 NOTE — Group Note (Signed)
 LCSW Group Therapy Note   Group Date: 09/13/2024 Start Time: 0100 End Time: 0200   Participation:  did not attend   Type of Therapy:  Group Therapy  Topic:  Healing From Within:  Understanding Our Past, Building Our Future"  Objective:  To help participants understand the impact of early experiences on mental and physical health, with a focus on Adverse Childhood Experiences (ACEs), and to explore ways to build resilience and healing.  Group Goals: Understand ACEs and Their Impact: Learn how childhood experiences shape mental and physical health. Build Resilience: Develop strategies for overcoming challenges and creating positive change. Promote Healing: Recognize the value of support and the possibility of healing through therapy and self-care.  Summary: In today's session, we discussed how early experiences, especially ACEs, impact mental and physical health. We explored the effects of stress, abuse, and neglect on brain development and well-being. The group focused on resilience, understanding that healing and positive change are possible with support and self-awareness.  Therapeutic Modalities Used: Psychoeducation: Sharing information about ACEs and their effects. Cognitive Behavioral Therapy (CBT): Helping reframe negative thought patterns. Trauma-Informed Therapy: Creating a safe, supportive space for healing.   Mirna Sutcliffe O Derwood Becraft, LCSWA 09/13/2024  2:09 PM

## 2024-09-13 NOTE — Progress Notes (Signed)
 Pt isolative throughout shift. Pt leave his room only to eat. Pt denies SI/HI/AVH when asked. Unable to assess for responding to internal stimuli as pt has isolated throughout shift.

## 2024-09-13 NOTE — Progress Notes (Signed)
(  Sleep Hours) -8.5 (Any PRNs that were needed, meds refused, or side effects to meds)- none (Any disturbances and when (visitation, over night)-none (Concerns raised by the patient)- none (SI/HI/AVH)-denies all

## 2024-09-13 NOTE — Group Note (Signed)
 Date:  09/13/2024 Time:  8:34 PM  Group Topic/Focus:  Wrap-Up Group:   The focus of this group is to help patients review their daily goal of treatment and discuss progress on daily workbooks.    Participation Level:  Did Not Attend   Brad Walters 09/13/2024, 8:34 PM

## 2024-09-13 NOTE — BH IP Treatment Plan (Signed)
 Interdisciplinary Treatment and Diagnostic Plan Update  09/13/2024 Time of Session: 1110am Brad Walters MRN: [id number]  Principal Diagnosis: Psychosis [facility])  Secondary Diagnoses: Principal Problem:   Psychosis (HCC) Active Problems:   Methamphetamine use disorder, severe (HCC)   Current Medications:  Current Facility-Administered Medications  Medication Dose Route Frequency Provider Last Rate Last Admin   acetaminophen  (TYLENOL ) tablet 650 mg  650 mg Oral Q6H PRN Mannie Jerel PARAS, NP   650 mg at 09/11/24 0303   alum & mag hydroxide-simeth (MAALOX/MYLANTA) 200-200-20 MG/5ML suspension 30 mL  30 mL Oral Q4H PRN Mannie Jerel PARAS, NP       haloperidol  (HALDOL ) tablet 5 mg  5 mg Oral TID PRN Mannie Jerel PARAS, NP       And   diphenhydrAMINE  (BENADRYL ) capsule 50 mg  50 mg Oral TID PRN Mannie Jerel PARAS, NP       haloperidol  lactate (HALDOL ) injection 10 mg  10 mg Intramuscular TID PRN Mannie Jerel PARAS, NP       And   diphenhydrAMINE  (BENADRYL ) injection 50 mg  50 mg Intramuscular TID PRN Mannie Jerel PARAS, NP       And   LORazepam  (ATIVAN ) injection 2 mg  2 mg Intramuscular TID PRN Mannie Jerel PARAS, NP       haloperidol  lactate (HALDOL ) injection 5 mg  5 mg Intramuscular TID PRN Mannie Jerel PARAS, NP       And   diphenhydrAMINE  (BENADRYL ) injection 50 mg  50 mg Intramuscular TID PRN Mannie Jerel PARAS, NP       And   LORazepam  (ATIVAN ) injection 2 mg  2 mg Intramuscular TID PRN Mannie Jerel PARAS, NP       magnesium  hydroxide (MILK OF MAGNESIA) suspension 30 mL  30 mL Oral Daily PRN Mannie Jerel PARAS, NP       nicotine  (NICODERM CQ  - dosed in mg/24 hours) patch 14 mg  14 mg Transdermal Daily Prentis Kitchens A, DO   14 mg at 09/11/24 0853   OLANZapine  (ZYPREXA ) tablet 7.5 mg  7.5 mg Oral Q12H Mannie Ashley SAILOR, MD   7.5 mg at 09/13/24 1027   PTA Medications: No medications prior to admission.    Patient Stressors: Health problems   Substance abuse    Patient Strengths: Ability for  insight  Communication skills   Treatment Modalities: Medication Management, Group therapy, Case management,  1 to 1 session with clinician, Psychoeducation, Recreational therapy.   Physician Treatment Plan for Primary Diagnosis: Psychosis (HCC) Long Term Goal(s):     Short Term Goals: Ability to demonstrate self-control will improve Ability to identify triggers associated with substance abuse/mental health issues will improve  Medication Management: Evaluate patient's response, side effects, and tolerance of medication regimen.  Therapeutic Interventions: 1 to 1 sessions, Unit Group sessions and Medication administration.  Evaluation of Outcomes: Not Progressing  Physician Treatment Plan for Secondary Diagnosis: Principal Problem:   Psychosis (HCC) Active Problems:   Methamphetamine use disorder, severe (HCC)  Long Term Goal(s):     Short Term Goals: Ability to demonstrate self-control will improve Ability to identify triggers associated with substance abuse/mental health issues will improve     Medication Management: Evaluate patient's response, side effects, and tolerance of medication regimen.  Therapeutic Interventions: 1 to 1 sessions, Unit Group sessions and Medication administration.  Evaluation of Outcomes: Not Progressing   RN Treatment Plan for Primary Diagnosis: Psychosis (HCC) Long Term Goal(s): Knowledge of disease and therapeutic regimen to maintain health will  improve  Short Term Goals: Ability to remain free from injury will improve, Ability to verbalize frustration and anger appropriately will improve, Ability to demonstrate self-control, Ability to participate in decision making will improve, Ability to verbalize feelings will improve, Ability to disclose and discuss suicidal ideas, Ability to identify and develop effective coping behaviors will improve, and Compliance with prescribed medications will improve  Medication Management: RN will administer  medications as ordered by provider, will assess and evaluate patient's response and provide education to patient for prescribed medication. RN will report any adverse and/or side effects to prescribing provider.  Therapeutic Interventions: 1 on 1 counseling sessions, Psychoeducation, Medication administration, Evaluate responses to treatment, Monitor vital signs and CBGs as ordered, Perform/monitor CIWA, COWS, AIMS and Fall Risk screenings as ordered, Perform wound care treatments as ordered.  Evaluation of Outcomes: Not Progressing   LCSW Treatment Plan for Primary Diagnosis: Psychosis (HCC) Long Term Goal(s): Safe transition to appropriate next level of care at discharge, Engage patient in therapeutic group addressing interpersonal concerns.  Short Term Goals: Engage patient in aftercare planning with referrals and resources, Increase social support, Increase ability to appropriately verbalize feelings, Increase emotional regulation, Facilitate acceptance of mental health diagnosis and concerns, Facilitate patient progression through stages of change regarding substance use diagnoses and concerns, Identify triggers associated with mental health/substance abuse issues, and Increase skills for wellness and recovery  Therapeutic Interventions: Assess for all discharge needs, 1 to 1 time with Social worker, Explore available resources and support systems, Assess for adequacy in community support network, Educate family and significant other(s) on suicide prevention, Complete Psychosocial Assessment, Interpersonal group therapy.  Evaluation of Outcomes: Not Progressing   Progress in Treatment: Attending groups: No. Participating in groups: No. Taking medication as prescribed: Yes. Toleration medication: Yes. Family/Significant other contact made: No, will contact:  Brad Walters (brother)  [phone] Patient understands diagnosis: No. Discussing patient identified problems/goals with staff:  Yes. Medical problems stabilized or resolved: Yes. Denies suicidal/homicidal ideation: Yes. Issues/concerns per patient self-inventory: No.  New problem(s) identified: No, Describe:  none  New Short Term/Long Term Goal(s): detox, medication management for mood stabilization; elimination of SI thoughts; development of comprehensive mental wellness/sobriety plan   Patient Goals:  Getting rest  Discharge Plan or Barriers: Patient recently admitted. CSW will continue to follow and assess for appropriate referrals and possible discharge planning.    Reason for Continuation of Hospitalization: Medication stabilization Other; describe paranoia  Estimated Length of Stay: 5-7 days  Last 3 [place] Suicide Severity Risk Score: Flowsheet Row [facility]Current[facility] Most recent reading at 09/10/2024  7:00 PM ED from 09/09/2024 in [facility] Most recent reading at 09/09/2024 11:41 PM ED from 09/09/2024 in [facility] Most recent reading at 09/09/2024  1:48 PM  C-SSRS RISK CATEGORY No Risk No Risk No Risk    Last PHQ 2/9 Scores:     No data to display          Scribe for Treatment Team: Jenkins LULLA Primer, LCSWA 09/13/2024 12:10 PM

## 2024-09-13 NOTE — Plan of Care (Signed)
  Problem: Education: Goal: Knowledge of [facility] Education information/materials will improve Outcome: Progressing Goal: Emotional status will improve Outcome: Progressing Goal: Mental status will improve Outcome: Progressing Goal: Verbalization of understanding the information provided will improve Outcome: Progressing   Problem: Activity: Goal: Interest or engagement in activities will improve Outcome: Progressing Goal: Sleeping patterns will improve Outcome: Progressing   Problem: Coping: Goal: Ability to verbalize frustrations and anger appropriately will improve Outcome: Progressing Goal: Ability to demonstrate self-control will improve Outcome: Progressing   Problem: Health Behavior/Discharge Planning: Goal: Identification of resources available to assist in meeting health care needs will improve Outcome: Progressing Goal: Compliance with treatment plan for underlying cause of condition will improve Outcome: Progressing   Problem: Physical Regulation: Goal: Ability to maintain clinical measurements within normal limits will improve Outcome: Progressing   Problem: Safety: Goal: Periods of time without injury will increase Outcome: Progressing   Problem: Education: Goal: Knowledge of [facility] Education information/materials will improve Outcome: Progressing Goal: Emotional status will improve Outcome: Progressing Goal: Mental status will improve Outcome: Progressing Goal: Verbalization of understanding the information provided will improve Outcome: Progressing   Problem: Education: Goal: Will be free of psychotic symptoms Outcome: Progressing Goal: Knowledge of the prescribed therapeutic regimen will improve Outcome: Progressing   Problem: Coping: Goal: Coping ability will improve Outcome: Progressing Goal: Will verbalize feelings Outcome: Progressing   Problem: Health Behavior/Discharge Planning: Goal: Compliance with prescribed  medication regimen will improve Outcome: Progressing   Problem: Activity: Goal: Will identify at least one activity in which they can participate Outcome: Progressing   Problem: Coping: Goal: Ability to identify and develop effective coping behavior will improve Outcome: Progressing Goal: Ability to interact with others will improve Outcome: Progressing Goal: Demonstration of participation in decision-making regarding own care will improve Outcome: Progressing Goal: Ability to use eye contact when communicating with others will improve Outcome: Progressing   Problem: Health Behavior/Discharge Planning: Goal: Identification of resources available to assist in meeting health care needs will improve Outcome: Progressing   Problem: Self-Concept: Goal: Will verbalize positive feelings about self Outcome: Progressing   Problem: Health Behavior/Discharge Planning: Goal: Ability to identify changes in lifestyle to reduce recurrence of condition will improve Outcome: Progressing Goal: Identification of resources available to assist in meeting health care needs will improve Outcome: Progressing   Problem: Physical Regulation: Goal: Complications related to the disease process, condition or treatment will be avoided or minimized Outcome: Progressing   Problem: Safety: Goal: Ability to remain free from injury will improve Outcome: Progressing

## 2024-09-13 NOTE — BHH Group Notes (Signed)
 Adult Psychoeducational Group Note  Date:  09/13/2024 Time:  4:13 PM  Group Topic/Focus: Physical [employer] Wellness Toolbox:   The focus of this group is to discuss various aspects of wellness, balancing those aspects and exploring ways to increase the ability to experience wellness.  Patients will create a wellness toolbox for use upon discharge.  Participation Level:  Did Not Attend  Participation Quality:    Affect:    Cognitive:    Insight:   Engagement in Group:    Modes of Intervention:    Additional Comments:    Annett Berle Hoyer 09/13/2024, 4:13 PM

## 2024-09-13 NOTE — Plan of Care (Signed)
   Problem: Activity: Goal: Interest or engagement in activities will improve Outcome: Progressing   Problem: Coping: Goal: Ability to demonstrate self-control will improve Outcome: Progressing

## 2024-09-13 NOTE — BHH Group Notes (Signed)
 Adult Psychoeducational Group Note  Date:  09/13/2024 Time:  9:56 AM  Group Topic/Focus: Goals Group Goals Group:   The focus of this group is to help patients establish daily goals to achieve during treatment and discuss how the patient can incorporate goal setting into their daily lives to aide in recovery.  Participation Level:  Did Not Attend  Participation Quality:    Affect:    Cognitive:    Insight:   Engagement in Group:    Modes of Intervention:    Additional Comments:    Annett Berle Hoyer 09/13/2024, 9:56 AM

## 2024-09-14 MED ORDER — OLANZAPINE 10 MG PO TABS
10.0000 mg | ORAL_TABLET | Freq: Two times a day (BID) | ORAL | Status: DC
  Administered 2024-09-14 – 2024-09-16 (×4): 10 mg via ORAL
  Filled 2024-09-14 (×4): qty 1

## 2024-09-14 NOTE — Group Note (Signed)
 Date:  09/14/2024 Time:  4:55 PM  Group Topic/Focus:  Dimensions of Wellness:   The focus of this group is to introduce the topic of wellness and discuss the role each dimension of wellness plays in total health.    Participation Level:  Did Not Attend   Brad Walters 09/14/2024, 4:55 PM

## 2024-09-14 NOTE — Group Note (Signed)
 Recreation Therapy Group Note   Group Topic:Coping Skills  Group Date: 09/14/2024 Start Time: 1050 End Time: 1120 Facilitators: Lainie Daubert-McCall, LRT,CTRS Location: 500 [address] Dayroom   Group Topic: Coping Skills   Goal Area(s) Addresses: Patient will define what a coping skill is. Patient will create a list of healthy coping skills beginning with each letter of the alphabet. Patient will successfully identify positive coping skills they can use post d/c.  Patient will acknowledge benefit(s) of using learned coping skills post d/c.   Behavioral Response:    Intervention: Worksheet   Activity: Coping A to Z. Patient asked to identify what a coping skill is and when they use them. Patients with clinical research associate discussed healthy versus unhealthy coping skills. Next patients were given a blank worksheet titled Coping Skills A-Z. Partners were instructed to come up with at least one positive coping skill per letter of the alphabet. Patients were given 15 minutes to brainstorm before ideas were presented to the large group. Patients and LRT debriefed on the importance of coping skill selection based on situation and back-up plans when a skill tried is not effective. At the end of group, patients were given an handout of alphabetized strategies to keep for future reference.   Education: Pharmacologist, Scientist, Physiological, Discharge Planning.    Education Outcome: Acknowledges education/Verbalizes understanding/In group clarification offered/Additional education needed   Affect/Mood: N/A   Participation Level: Did not attend    Clinical Observations/Individualized Feedback:     Plan: Continue to engage patient in RT group sessions 2-3x/week.   Yehoshua Vitelli-McCall, LRT,CTRS 09/14/2024 1:43 PM

## 2024-09-14 NOTE — Progress Notes (Signed)
 Conversation with patient:  CSW offered inpatient substance use treatment programs, and intensive outpatient (3 x per week), but patient declined.    Patient said that his brother will pick him up.  He wasn't sure if he will go to his mom's home, or somewhere else.  Patient said that he doesn't have access to firearms.  He said there are no firearms where he goes upon discharge.   Syair Fricker, LCSWA 09/14/2024

## 2024-09-14 NOTE — Progress Notes (Signed)
(  Sleep Hours) -8.75  (Any PRNs that were needed, meds refused, or side effects to meds)-none   (Any disturbances and when (visitation, over night)-none  (Concerns raised by the patient)- pt argues when he is asked if he is SI , pt appears to try to find something to argue with .   (SI/HI/AVH)-denies

## 2024-09-14 NOTE — [facility] Suicide Risk Assessment (Signed)
 [facility] INPATIENT:  Family/Significant Other Suicide Prevention Education  Suicide Prevention Education:  Education Completed; Brad Walters (brother)  [phone],  (name of family member/significant other) has been identified by the patient as the family member/significant other with whom the patient will be residing, and identified as the person(s) who will aid the patient in the event of a mental health crisis (suicidal ideations/suicide attempt).  With written consent from the patient, the family member/significant other has been provided the following suicide prevention education, prior to the and/or following the discharge of the patient.  Brother said that patient doesn't have access to firearms, and there are no firearms where he would go upon discharge.  Brother said that patient could possibly come to his home, but he will speak with his family members, and help patient figure out where he will go upon discharge.    Brother said he will not be available to pick up patient because he will be at work.  CSW emailed brother the resources [email or web address]  The suicide prevention education provided includes the following: Suicide risk factors Suicide prevention and interventions National Suicide Hotline telephone number [facility] assessment telephone number [facility] Emergency Assistance 911 [facility] and/or Residential Mobile Crisis Unit telephone number  Request made of family/significant other to: Remove weapons (e.g., guns, rifles, knives), all items previously/currently identified as safety concern.   Remove drugs/medications (over-the-counter, prescriptions, illicit drugs), all items previously/currently identified as a safety concern.  The family member/significant other verbalizes understanding of the suicide prevention education information provided.  The family member/significant other agrees to remove the items of safety concern listed above.  Brad Walters  O Brad Walters, LCSWA 09/14/2024, 5:35 PM

## 2024-09-14 NOTE — Plan of Care (Signed)
   Problem: Activity: Goal: Sleeping patterns will improve Outcome: Progressing   Problem: Safety: Goal: Periods of time without injury will increase Outcome: Progressing   Problem: Education: Goal: Emotional status will improve Outcome: Not Progressing Goal: Mental status will improve Outcome: Not Progressing   Problem: Activity: Goal: Interest or engagement in activities will improve Outcome: Not Progressing

## 2024-09-14 NOTE — Progress Notes (Signed)
  Pt is aggressive and is triggered when asked if he had any SI/HI/paranoia/AVH. He has been isolating all day and only comes out for meals and medications. He became agitated asking to leave but was explained to him that he was IVC'd and that the doctor determines when he is to go home. He then sulked back to his room.

## 2024-09-14 NOTE — Plan of Care (Incomplete)
Pt denies SI/HI/AVH

## 2024-09-14 NOTE — Progress Notes (Signed)
 Shift Note  (Sleep Hours) - 8.25  (Any PRNs that were needed, meds refused, or side effects to meds)- None  (Any disturbances and when (visitation, over night)- None  (Concerns raised by the patient)- None  (SI/HI/AVH)- Denies

## 2024-09-14 NOTE — [facility] Group Notes (Signed)
 Adult Psychoeducational Group Note  Date:  09/14/2024 Time:  8:41 PM  Group Topic/Focus:  Wrap-Up Group:   The focus of this group is to help patients review their daily goal of treatment and discuss progress on daily workbooks.  Participation Level:  Did Not Attend  Brad Walters 09/14/2024, 8:41 PM

## 2024-09-14 NOTE — [facility] Group Notes (Incomplete)
 Adult Psychoeducational Group Note  Date:  09/14/2024 Time:  12:01 PM  Group Topic/Focus: Goals Group Goals Group:   The focus of this group is to help patients establish daily goals to achieve during treatment and discuss how the patient can incorporate goal setting into their daily lives to aide in recovery.  Participation Level:  Did Not Attend  Participation Quality:    Affect:    Cognitive:    Insight:   Engagement in Group:    Modes of Intervention:    Additional Comments:    Annett Berle Hoyer 09/14/2024, 12:01 PM

## 2024-09-14 NOTE — Progress Notes (Signed)
 [facility] Inpatient Psychiatry Progress Note  Date: 09/14/2024 Patient: Brad Walters MRN: [id number]  ASSESSMENT: Patient is a 38 year old male with a history of polysubstance use presenting involuntarily after recent arrest for trespassing at his children's mother's home. He was reported to be responding to internal stimuli and making homicidal threats. He was brought to [facility] by his brother after an ultimatum was given for inpatient psychiatric care. Given patient's history of methamphetamine use and lack of significant prior psychiatric history beyond substance use, his presentation appears most consistent with substance-induced psychosis. However, still need to rule out primary psychiatric disorder.  12/2: No significant changes from yesterday. Patient appears guarded and irritable with flat affect, though does not seem to be responding to internal stimuli. Patient continues to be isolated to his room, asocial only coming out for lunch.  Patient taking medications without issue, will increase zyprexa  to address psychosis.  PLAN: Psychiatric Diagnoses and Treatment # Substance-induced psychosis - increase Zyprexa  to 10 mg BID   PRN's - Trazodone  50 mg at bedtime as needed for insomnia - Atarax  25 mg TID as needed for anxiety - Agitation Protocol: haldol  + ativan  + benadryl    2. Active Medical Issues #Nicotine  withdrawal - Patient in need of nicotine  replacement; nicotine  patch 14 mg / 24 hours ordered. Smoking cessation encouraged   Other as needed medications  Tylenol  650 mg every 6 hours as needed for pain Mylanta 30 mL every 4 hours as needed for indigestion Milk of magnesia 30 mL daily as needed for constipation  Risk Assessment: Patient continues to require inpatient hospitalization for safety and stabilization of acute psychosis and paranoia.  Discharge Planning: Barriers to Discharge: acute psychosis, paranoia Estimated Length of Stay: 2-5  days Predicted Discharge Location: TBD  INTERVAL HISTORY: Chart reviewed. No significant events overnight. Per chart review, patient noted to be guarded and isolative to room. On assessment today, patient continues to denies auditory or visual hallucinations. He denies suicidal or homicidal ideation. Patient denies medication side effects or somatic complaints.  Discussed discharge planning with patient, he plans to stay with his mother.  Patient says he has not spoken to anyone during this hospitalization.  Asked patient if I could call his mother, and becomes paranoid and questioning and motivation.  Patient says I am able to call his brother that is in the chart, and begins perseverating to only call his brother that is in the chart.  Attempted to reach Rogers Daring, mother, [phone], VM box not set up.  PRN's in the last 24 hours include none.  Physical Examination:  Vitals and nursing note reviewed MSK: Normal gait and station  MENTAL STATUS EXAM:  Appearance: Black male wearing paper scrubs  Behavior: Cooperative, poor eye contact  Attitude: Irritable  Speech: Normal rate, rhythm, tone  Mood: Irritated  Affect: Flat  Thought Process: Linear, logical, goal directed  Thought Content: No delusional thought content endorsed.  SI/HI: Denies  Perceptions: Denies AVH. Not observed responding to internal stimuli.  Judgement: Poor  Insight: Poor  Fund of Knowledge: WNL   Lab Results:  No visits with results within 1 Day(s) from this visit.  Latest known visit with results is:  Admission on 09/09/2024, Discharged on 09/10/2024  Component Date Value Ref Range Status   WBC 09/09/2024 6.0  4.0 - 10.5 K/uL Final   RBC 09/09/2024 4.37  4.22 - 5.81 MIL/uL Final   Hemoglobin 09/09/2024 13.4  13.0 - 17.0 g/dL Final   HCT 88/72/7974 40.2  39.0 - 52.0 % Final   MCV 09/09/2024 92.0  80.0 - 100.0 fL Final   MCH 09/09/2024 30.7  26.0 - 34.0 pg Final   MCHC 09/09/2024 33.3  30.0 - 36.0  g/dL Final   RDW 88/72/7974 12.8  11.5 - 15.5 % Final   Platelets 09/09/2024 242  150 - 400 K/uL Final   nRBC 09/09/2024 0.0  0.0 - 0.2 % Final   Neutrophils Relative % 09/09/2024 46  % Final   Neutro Abs 09/09/2024 2.8  1.7 - 7.7 K/uL Final   Lymphocytes Relative 09/09/2024 39  % Final   Lymphs Abs 09/09/2024 2.4  0.7 - 4.0 K/uL Final   Monocytes Relative 09/09/2024 9  % Final   Monocytes Absolute 09/09/2024 0.6  0.1 - 1.0 K/uL Final   Eosinophils Relative 09/09/2024 5  % Final   Eosinophils Absolute 09/09/2024 0.3  0.0 - 0.5 K/uL Final   Basophils Relative 09/09/2024 1  % Final   Basophils Absolute 09/09/2024 0.0  0.0 - 0.1 K/uL Final   Immature Granulocytes 09/09/2024 0  % Final   Abs Immature Granulocytes 09/09/2024 0.01  0.00 - 0.07 K/uL Final   Sodium 09/09/2024 138  135 - 145 mmol/L Final   Potassium 09/09/2024 3.5  3.5 - 5.1 mmol/L Final   Chloride 09/09/2024 102  98 - 111 mmol/L Final   CO2 09/09/2024 26  22 - 32 mmol/L Final   Glucose, Bld 09/09/2024 126 (H)  70 - 99 mg/dL Final   BUN 88/72/7974 14  6 - 20 mg/dL Final   Creatinine, Ser 09/09/2024 1.26 (H)  0.61 - 1.24 mg/dL Final   Calcium 88/72/7974 8.8 (L)  8.9 - 10.3 mg/dL Final   Total Protein 88/72/7974 6.0 (L)  6.5 - 8.1 g/dL Final   Albumin 88/72/7974 3.5  3.5 - 5.0 g/dL Final   AST 88/72/7974 17  15 - 41 U/L Final   ALT 09/09/2024 12  0 - 44 U/L Final   Alkaline Phosphatase 09/09/2024 35 (L)  38 - 126 U/L Final   Total Bilirubin 09/09/2024 0.6  0.0 - 1.2 mg/dL Final   GFR, Estimated 09/09/2024 >60  >60 mL/min Final   Anion gap 09/09/2024 10  5 - 15 Final   Magnesium  09/09/2024 1.9  1.7 - 2.4 mg/dL Final   Glucose-Capillary 09/10/2024 116 (H)  70 - 99 mg/dL Final   Opiates 88/71/7974 NONE DETECTED  NONE DETECTED Final   Cocaine 09/10/2024 NONE DETECTED  NONE DETECTED Final   Benzodiazepines 09/10/2024 NONE DETECTED  NONE DETECTED Final   Amphetamines 09/10/2024 POSITIVE (A)  NONE DETECTED Final    Tetrahydrocannabinol 09/10/2024 NONE DETECTED  NONE DETECTED Final   Barbiturates 09/10/2024 NONE DETECTED  NONE DETECTED Final   Alcohol, Ethyl (B) 09/09/2024 <15  <15 mg/dL Final   Lactic Acid, Venous 09/10/2024 1.5  0.5 - 1.9 mmol/L Final   Total CK 09/10/2024 90  49 - 397 U/L Final   Acetaminophen  (Tylenol ), Serum 09/10/2024 <10 (L)  10 - 30 ug/mL Final   Salicylate Lvl 09/10/2024 <7.0 (L)  7.0 - 30.0 mg/dL Final     Vitals: Blood pressure 106/70, pulse 71, temperature 97.6 F (36.4 C), temperature source Oral, resp. rate 14, height 5' 7 (1.702 m), weight 60.3 kg, SpO2 98%.   Alfornia Light, DO PGY-1, Psychiatry Residency  09/14/2024, 7:07 AM

## 2024-09-15 NOTE — [facility] Group Notes (Signed)
 Adult Psychoeducational Group Note  Date:  09/15/2024 Time:  9:40 AM  Group Topic/Focus: Goals Group Goals Group:   The focus of this group is to help patients establish daily goals to achieve during treatment and discuss how the patient can incorporate goal setting into their daily lives to aide in recovery.  Participation Level:  Did Not Attend  Participation Quality:    Affect:    Cognitive:    Insight:   Engagement in Group:    Modes of Intervention:    Additional Comments:    Annett Berle Hoyer 09/15/2024, 9:40 AM

## 2024-09-15 NOTE — Progress Notes (Signed)
 Recreation Therapy Notes  INPATIENT RECREATION THERAPY ASSESSMENT  Patient Details Name: Brad Walters MRN: [id number] DOB: 03-Feb-1986 Today's Date: 09/15/2024       Information Obtained From: Patient  Able to Participate in Assessment/Interview: Yes  Patient Presentation: Responsive (Laying face down on bed, eyes closed and hard to understand)  Reason for Admission (Per Patient): Other (Comments), Substance Abuse (per chart: psychotic features)  Patient Stressors: Other (Comment) (None identified)  Coping Skills:   Sports, TV, Music, Meditate, Deep Breathing, Art, Talk, Prayer  Leisure Interests (2+):  Individual - Other (Comment) (Rest)  Frequency of Recreation/Participation: Other (Comment) (Daily)  Awareness of Community Resources:  Yes  Community Resources:  [place], [place], Public Affairs Consultant  Current Use: Yes  If no, Barriers?:    Expressed Interest in [employer] Information: No  [employer] of Residence:  [place]  Patient Main Form of Transportation: Uber/Lyft  Patient Strengths:  I don't know  Patient Identified Areas of Improvement:  No  Patient Goal for Hospitalization:  Rest  Current SI (including self-harm):  No  Current HI:  No  Current AVH: No  Staff Intervention Plan: Group Attendance, Collaborate with Interdisciplinary Treatment Team  Consent to Intern Participation: N/A   Ric Rosenberg-McCall, LRT,CTRS [place] Geffr[place]-McCall 09/15/2024, 2:20 PM

## 2024-09-15 NOTE — Group Note (Signed)
 Recreation Therapy Group Note   Group Topic:Team Building  Group Date: 09/15/2024 Start Time: 1021 End Time: 1038 Facilitators: Tyrez Berrios-McCall, LRT,CTRS Location: [address] Dayroom   Group Topic: Communication, Team Building, Problem Solving  Goal Area(s) Addresses:  Patient will effectively work with peer towards shared goal.  Patient will identify skills used to make activity successful.  Patient will identify how skills used during activity can be used to reach post d/c goals.   Behavioral Response:   Intervention: STEM Activity  Activity: Straw Bridge. In teams of 3-5, patients were given 15 plastic drinking straws and an equal length of masking tape. Using the materials provided, patients were instructed to build a free standing bridge-like structure to suspend an everyday item (ex: puzzle box) off of the floor or table surface. All materials were required to be used by the team in their design. LRT facilitated post-activity discussion reviewing team process. Patients were encouraged to reflect how the skills used in this activity can be generalized to daily life post discharge.   Education: Pharmacist, Community, Scientist, Physiological, Discharge Planning   Education Outcome: Acknowledges education/In group clarification offered/Needs additional education.    Affect/Mood: N/A   Participation Level: Did not attend    Clinical Observations/Individualized Feedback:      Plan: Continue to engage patient in RT group sessions 2-3x/week.   Haygen Zebrowski-McCall, LRT,CTRS 09/15/2024 1:36 PM

## 2024-09-15 NOTE — Group Note (Signed)
 Date:  09/15/2024 Time:  5:45 PM  Group Topic/Focus:  Goals Group:   The focus of this group is to help patients establish daily goals to achieve during treatment and discuss how the patient can incorporate goal setting into their daily lives to aide in recovery.    Participation Level:  Did Not Attend  Participation Quality:    Affect:    Cognitive:    Insight:   Engagement in Group:    Modes of Intervention:    Additional Comments:    Brad Walters 09/15/2024, 5:45 PM

## 2024-09-15 NOTE — [facility] Suicide Risk Assessment (Addendum)
 [facility] INPATIENT:  Family/Significant Other Suicide Prevention Education  Suicide Prevention Education:  Education Completed; Jaimie Redditt (mom) [phone],  (name of family member/significant other) has been identified by the patient as the family member/significant other with whom the patient will be residing, and identified as the person(s) who will aid the patient in the event of a mental health crisis (suicidal ideations/suicide attempt).  With written consent from the patient, the family member/significant other has been provided the following suicide prevention education, prior to the and/or following the discharge of the patient.  Mom said that patient doesn't have access to firearms, and there are no firearms in the home.  Mom said that she works during the day, so patient would need to use a taxi.   He can come home to her home when she is at work:  [employer], [address], [place], [place] [address].  Mom said that patient struggles with substance use, and would like that he receives treatment.  CSW emailed the resources to [email or web address]  The suicide prevention education provided includes the following: Suicide risk factors Suicide prevention and interventions National Suicide Hotline telephone number [facility] assessment telephone number [facility] Emergency Assistance 911 [facility] and/or Residential Mobile Crisis Unit telephone number  Request made of family/significant other to: Remove weapons (e.g., guns, rifles, knives), all items previously/currently identified as safety concern.   Remove drugs/medications (over-the-counter, prescriptions, illicit drugs), all items previously/currently identified as a safety concern.  The family member/significant other verbalizes understanding of the suicide prevention education information provided.  The family member/significant other agrees to remove the items of safety concern listed above.  Zakyla Tonche O  Vaeda Westall, LCSWA 09/15/2024, 1:13 PM

## 2024-09-15 NOTE — [facility] Group Notes (Signed)
 Adult Psychoeducational Group Note  Date:  09/15/2024 Time:  2:56 PM  Group Topic/Focus: Physical Wellness Wellness Toolbox:   The focus of this group is to discuss various aspects of wellness, balancing those aspects and exploring ways to increase the ability to experience wellness.  Patients will create a wellness toolbox for use upon discharge.  Participation Level:  Did Not Attend  Participation Quality:    Affect:    Cognitive:    Insight:   Engagement in Group:    Modes of Intervention:    Additional Comments:    Annett Berle Hoyer 09/15/2024, 2:56 PM

## 2024-09-15 NOTE — Plan of Care (Signed)
   Problem: Education: Goal: Emotional status will improve Outcome: Progressing Goal: Mental status will improve Outcome: Progressing Goal: Verbalization of understanding the information provided will improve Outcome: Progressing

## 2024-09-15 NOTE — Progress Notes (Signed)
 [facility] Inpatient Psychiatry Progress Note  Date: 09/15/2024 Patient: Brad Walters MRN: [id number]  ASSESSMENT: Patient is a 38 year old male with a history of polysubstance use presenting involuntarily after recent arrest for trespassing at his children's mother's home. He was reported to be responding to internal stimuli and making homicidal threats. He was brought to [facility] by his brother after an ultimatum was given for inpatient psychiatric care. Given patient's history of methamphetamine use and lack of significant prior psychiatric history beyond substance use, his presentation appears most consistent with substance-induced psychosis. However, still need to rule out primary psychiatric disorder.  12/3: Patient continues to be isolated to his room, asocial only coming out for lunch. Patient taking medications without issue, still exhibiting some negative features with abnormal affect though paranoia seems to have improved from previous day. Patient has some bizarre thought content, though not observed responding to internal stimuli or appears grossly psychotic.  Will continue Zyprexa  for ongoing negative features, and plan for discharge tomorrow.  PLAN: Psychiatric Diagnoses and Treatment # Substance-induced psychosis - Continue Zyprexa  10 mg BID   PRN's - Trazodone  50 mg at bedtime as needed for insomnia - Atarax  25 mg TID as needed for anxiety - Agitation Protocol: haldol  + ativan  + benadryl    2. Active Medical Issues #Nicotine  withdrawal - Patient in need of nicotine  replacement; nicotine  patch 14 mg / 24 hours ordered. Smoking cessation encouraged   Other as needed medications  Tylenol  650 mg every 6 hours as needed for pain Mylanta 30 mL every 4 hours as needed for indigestion Milk of magnesia 30 mL daily as needed for constipation  Risk Assessment: Patient continues to require inpatient hospitalization for safety and stabilization of acute  psychosis and paranoia.  Discharge Planning: Barriers to Discharge: acute psychosis, paranoia Estimated Length of Stay: 1-3 days Predicted Discharge Location: TBD  INTERVAL HISTORY: Chart reviewed. No significant events overnight. Per chart review, patient noted to be guarded and isolative to room. On assessment today, patient apologizes for irritable attitude towards staff a few days ago. Continues to denies auditory or visual hallucinations. He denies suicidal or homicidal ideation. Patient denies medication side effects or somatic complaints.  Discussed discharge planning with patient, he plans to stay with his mother, whom patient is calling today.  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  PRN's in the last 24 hours include none.  Physical Examination:  Vitals and nursing note reviewed MSK: Normal gait and station  MENTAL STATUS EXAM:  Appearance: Black male wearing paper scrubs  Behavior: Cooperative, good eye contact  Attitude: Polite, calm  Speech: Normal rate, rhythm, tone  Mood: Fine  Affect: Flat  Thought Process: Linear, goal directed  Thought Content: Bizarre, spiritually sells art  SI/HI: Denies  Perceptions: Denies AVH. Not observed responding to internal stimuli.  Judgement: fair  Insight: Poor  Fund of Knowledge: WNL   Lab Results:  No visits with results within 1 Day(s) from this visit.  Latest known visit with results is:  Admission on 09/09/2024, Discharged on 09/10/2024  Component Date Value Ref Range Status   WBC 09/09/2024 6.0  4.0 - 10.5 K/uL Final   RBC 09/09/2024 4.37  4.22 -  5.81 MIL/uL Final   Hemoglobin 09/09/2024 13.4  13.0 - 17.0 g/dL Final   HCT 88/72/7974 40.2  39.0 - 52.0 % Final    MCV 09/09/2024 92.0  80.0 - 100.0 fL Final   MCH 09/09/2024 30.7  26.0 - 34.0 pg Final   MCHC 09/09/2024 33.3  30.0 - 36.0 g/dL Final   RDW 88/72/7974 12.8  11.5 - 15.5 % Final   Platelets 09/09/2024 242  150 - 400 K/uL Final   nRBC 09/09/2024 0.0  0.0 - 0.2 % Final   Neutrophils Relative % 09/09/2024 46  % Final   Neutro Abs 09/09/2024 2.8  1.7 - 7.7 K/uL Final   Lymphocytes Relative 09/09/2024 39  % Final   Lymphs Abs 09/09/2024 2.4  0.7 - 4.0 K/uL Final   Monocytes Relative 09/09/2024 9  % Final   Monocytes Absolute 09/09/2024 0.6  0.1 - 1.0 K/uL Final   Eosinophils Relative 09/09/2024 5  % Final   Eosinophils Absolute 09/09/2024 0.3  0.0 - 0.5 K/uL Final   Basophils Relative 09/09/2024 1  % Final   Basophils Absolute 09/09/2024 0.0  0.0 - 0.1 K/uL Final   Immature Granulocytes 09/09/2024 0  % Final   Abs Immature Granulocytes 09/09/2024 0.01  0.00 - 0.07 K/uL Final   Sodium 09/09/2024 138  135 - 145 mmol/L Final   Potassium 09/09/2024 3.5  3.5 - 5.1 mmol/L Final   Chloride 09/09/2024 102  98 - 111 mmol/L Final   CO2 09/09/2024 26  22 - 32 mmol/L Final   Glucose, Bld 09/09/2024 126 (H)  70 - 99 mg/dL Final   BUN 88/72/7974 14  6 - 20 mg/dL Final   Creatinine, Ser 09/09/2024 1.26 (H)  0.61 - 1.24 mg/dL Final   Calcium 88/72/7974 8.8 (L)  8.9 - 10.3 mg/dL Final   Total Protein 88/72/7974 6.0 (L)  6.5 - 8.1 g/dL Final   Albumin 88/72/7974 3.5  3.5 - 5.0 g/dL Final   AST 88/72/7974 17  15 - 41 U/L Final   ALT 09/09/2024 12  0 - 44 U/L Final   Alkaline Phosphatase 09/09/2024 35 (L)  38 - 126 U/L Final   Total Bilirubin 09/09/2024 0.6  0.0 - 1.2 mg/dL Final   GFR, Estimated 09/09/2024 >60  >60 mL/min Final   Anion gap 09/09/2024 10  5 - 15 Final   Magnesium  09/09/2024 1.9  1.7 - 2.4 mg/dL Final   Glucose-Capillary 09/10/2024 116 (H)  70 - 99 mg/dL Final   Opiates 88/71/7974 NONE DETECTED  NONE DETECTED Final   Cocaine 09/10/2024 NONE DETECTED  NONE DETECTED Final    Benzodiazepines 09/10/2024 NONE DETECTED  NONE DETECTED Final   Amphetamines 09/10/2024 POSITIVE (A)  NONE DETECTED Final   Tetrahydrocannabinol 09/10/2024 NONE DETECTED  NONE DETECTED Final   Barbiturates 09/10/2024 NONE DETECTED  NONE DETECTED Final   Alcohol, Ethyl (B) 09/09/2024 <15  <15 mg/dL Final   Lactic Acid, Venous 09/10/2024 1.5  0.5 - 1.9 mmol/L Final   Total CK 09/10/2024 90  49 - 397 U/L Final   Acetaminophen  (Tylenol ), Serum 09/10/2024 <10 (L)  10 - 30 ug/mL Final   Salicylate Lvl 09/10/2024 <7.0 (L)  7.0 - 30.0 mg/dL Final     Vitals: Blood pressure 119/73, pulse 87, temperature 97.6 F (36.4 C), temperature source Oral, resp. rate 14, height 5' 7 (1.702 m), weight 60.3 kg, SpO2 99%.   Alfornia Light, DO PGY-1, Psychiatry Residency  09/15/2024, 7:19 AM

## 2024-09-15 NOTE — Progress Notes (Signed)
 Pt guarded and isolative to room majority of this shift. A & O to self, place and situation. Denies SI, HI, AVH and pain when assessed. Refused to attend groups as scheduled despite multiple prompts. Tolerates meals, fluids and medications well. Emotional support, encouragement and reassurance offered. Safety checks maintained at Q 15 minutes intervals.

## 2024-09-15 NOTE — Plan of Care (Signed)
   Problem: Education: Goal: Knowledge of [facility] Education information/materials will improve Outcome: Progressing Goal: Emotional status will improve Outcome: Progressing Goal: Mental status will improve Outcome: Progressing Goal: Verbalization of understanding the information provided will improve Outcome: Progressing   Problem: Coping: Goal: Ability to verbalize frustrations and anger appropriately will improve Outcome: Progressing Goal: Ability to demonstrate self-control will improve Outcome: Progressing

## 2024-09-15 NOTE — BHH Group Notes (Signed)
 Adult Psychoeducational Group Note  Date:  09/15/2024 Time:  7:59 PM  Group Topic/Focus: Wrap up Group Wrap-Up Group:   The focus of this group is to help patients review their daily goal of treatment and discuss progress on daily workbooks.  Participation Level:  Did Not Attend  Participation Quality:    Affect:    Cognitive:    Insight:   Engagement in Group:    Modes of Intervention:    Additional Comments:    Brad Walters 09/15/2024, 7:59 PM

## 2024-09-15 NOTE — Progress Notes (Signed)
(  Sleep Hours) - (Any PRNs that were needed, meds refused, or side effects to meds)- none (Any disturbances and when (visitation, over night)- none reported (Concerns raised by the patient)- none reported (SI/HI/AVH)- Denies all  Pt observed in bed.  Calm and cooperative during assessment.

## 2024-09-15 NOTE — BHH Group Notes (Signed)
 Adult Psychoeducational Group Note  Date:  09/15/2024 Time:  1:32 PM  Group Topic/Focus: Emotional Wellness   Participation Level:  Did Not Attend   Brad Walters Ruth 09/15/2024, 1:32 PM

## 2024-09-16 DIAGNOSIS — F1590 Other stimulant use, unspecified, uncomplicated: Secondary | ICD-10-CM | POA: Insufficient documentation

## 2024-09-16 MED ORDER — OLANZAPINE 10 MG PO TABS
10.0000 mg | ORAL_TABLET | Freq: Three times a day (TID) | ORAL | Status: DC
  Administered 2024-09-16 – 2024-09-21 (×12): 10 mg via ORAL
  Filled 2024-09-16 (×4): qty 1
  Filled 2024-09-16: qty 30
  Filled 2024-09-16 (×9): qty 1
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
## 2024-09-16 NOTE — BHH Group Notes (Signed)
 Adult Psychoeducational Group Note  Date:  09/16/2024 Time:  11:46 AM  Group Topic/Focus: Nutrition Group  Participation Level:  Did Not Attend  Participation Quality:    Affect:    Cognitive:    Insight:   Engagement in Group:    Modes of Intervention:    Additional Comments:    Brad Walters 09/16/2024, 11:46 AM

## 2024-09-16 NOTE — Group Note (Signed)
 Date:  09/16/2024 Time:  9:24 AM  Group Topic/Focus:  Goals Group:   The focus of this group is to help patients establish daily goals to achieve during treatment and discuss how the patient can incorporate goal setting into their daily lives to aide in recovery. Orientation:   The focus of this group is to educate the patient on the purpose and policies of crisis stabilization and provide a format to answer questions about their admission.  The group details unit policies and expectations of patients while admitted.    Participation Level:  Did Not Attend   Brad Walters 09/16/2024, 9:24 AM

## 2024-09-16 NOTE — BHH Group Notes (Signed)
 Adult Psychoeducational Group Note  Date:  09/16/2024 Time:  3:40 PM  Group Topic/Focus: Occupation Therapy   Participation Level:  Did Not Attend    Brad Walters 09/16/2024, 3:40 PM
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
## 2024-09-16 NOTE — BHH Group Notes (Signed)
 Adult Psychoeducational Group Note  Date:  09/16/2024 Time:  12:46 PM  Group Topic/Focus: Physical Wellness  Participation Level:  Did Not Attend  Participation Quality:    Affect:    Cognitive:    Insight:   Engagement in Group:    Modes of Intervention:    Additional Comments:    Brad Walters 09/16/2024, 12:46 PM

## 2024-09-16 NOTE — Plan of Care (Signed)
   Problem: Education: Goal: Emotional status will improve Outcome: Not Progressing Goal: Mental status will improve Outcome: Not Progressing

## 2024-09-16 NOTE — Plan of Care (Signed)
   Problem: Education: Goal: Knowledge of [place] [facility] Education information/materials will improve Outcome: Progressing Goal: Emotional status will improve Outcome: Progressing Goal: Mental status will improve Outcome: Progressing Goal: Verbalization of understanding the information provided will improve Outcome: Progressing

## 2024-09-16 NOTE — B[facility] Group Notes (Signed)
 Adult Psychoeducational Group Note  Date:  09/16/2024 Time:  3:39 PM  Group Topic/Focus: Occupational Therapy   Participation Level:  Did Not Attend    Brad Walters 09/16/2024, 3:39 PM

## 2024-09-16 NOTE — Group Note (Signed)
 Recreation Therapy Group Note   Group Topic:Communication  Group Date: 09/16/2024 Start Time: 1006 End Time: 1030 Facilitators: Cynthis Purington-McCall, LRT,CTRS Location: [address] Dayroom   Group Topic: Communication, Problem Solving   Goal Area(s) Addresses:  Patient will effectively listen to complete activity.  Patient will identify communication skills used to make activity successful.  Patient will identify how skills used during activity can be used to reach post d/c goals.    Behavioral Response:   Intervention: Building Surveyor Activity - Geometric pattern cards, pencils, blank paper    Activity: Geometric Drawings.  Three volunteers from the peer group will be shown an abstract picture with a particular arrangement of geometrical shapes.  Each round, one 'speaker' will describe the pattern, as accurately as possible without revealing the image to the group.  The remaining group members will listen and draw the picture to reflect how it is described to them. Patients with the role of 'listener' cannot ask clarifying questions but, may request that the speaker repeat a direction. Once the drawings are complete, the presenter will show the rest of the group the picture and compare how close each person came to drawing the picture. LRT will facilitate a post-activity discussion regarding effective communication and the importance of planning, listening, and asking for clarification in daily interactions with others.  Education: Environmental consultant, Active listening, Support systems, Discharge planning  Education Outcome: Acknowledges understanding/In group clarification offered/Needs additional education.    Affect/Mood: N/A   Participation Level: Did not attend    Clinical Observations/Individualized Feedback:     Plan: Continue to engage patient in RT group sessions 2-3x/week.   Khamiya Varin-McCall, LRT,CTRS 09/16/2024 12:19 PM

## 2024-09-16 NOTE — Group Note (Signed)
 Date:  09/16/2024 Time:  8:24 PM  Group Topic/Focus:  Wrap-Up Group:   The focus of this group is to help patients review their daily goal of treatment and discuss progress on daily workbooks.    Participation Level:  Did Not Attend   Tristen Pennino Dacosta 09/16/2024, 8:24 PM

## 2024-09-16 NOTE — B[facility] Group Notes (Signed)
 Adult Psychoeducational Group Note  Date:  09/16/2024 Time:  11:45 AM  Group Topic/Focus: Recreational Therapy  Participation Level:  Did Not Attend  Participation Quality:    Affect:    Cognitive:    Insight:   Engagement in Group:    Modes of Intervention:    Additional Comments:    Yosselyn Tax O 09/16/2024, 11:45 AM

## 2024-09-16 NOTE — BHH Group Notes (Signed)
 Adult Psychoeducational Group Note  Date:  09/16/2024 Time:  4:48 PM  Group Topic/Focus: Peer Support ([facility])  Participation Level:  Did Not Attend  Participation Quality:    Affect:    Cognitive:    Insight:   Engagement in Group:    Modes of Intervention:    Additional Comments:    Fowler Antos O 09/16/2024, 4:48 PM

## 2024-09-17 ENCOUNTER — Encounter (HOSPITAL_COMMUNITY): Payer: Self-pay

## 2024-09-17 NOTE — Group Note (Signed)
 Date:  09/17/2024 Time:  8:27 PM  Group Topic/Focus:  Wrap-Up Group:   The focus of this group is to help patients review their daily goal of treatment and discuss progress on daily workbooks.    Participation Level:  Did Not Attend  Brad Walters 09/17/2024, 8:27 PM

## 2024-09-17 NOTE — Progress Notes (Signed)
(  Sleep Hours) -11.25 as of 0530 (Any PRNs that were needed, meds refused, or side effects to meds)- none (Any disturbances and when (visitation, over night)-none (Concerns raised by the patient)- none (SI/HI/AVH)- denies all

## 2024-09-17 NOTE — Group Note (Signed)
 Date:  09/17/2024 Time:  9:23 AM  Group Topic/Focus:  Goals Group:   The focus of this group is to help patients establish daily goals to achieve during treatment and discuss how the patient can incorporate goal setting into their daily lives to aide in recovery.    Participation Level:  Did Not Attend  Participation Quality:  NA  Affect:  NA  Cognitive:  NA  Insight: NA  Engagement in Group:  NA  Modes of Intervention:  NA  Additional Comments:  Patient did not attend goals group.  Rosaleen BIRCH Omarii Scalzo 09/17/2024, 9:23 AM

## 2024-09-17 NOTE — Progress Notes (Signed)
   09/17/24 1100  Psych Admission Type (Psych Patients Only)  Admission Status Involuntary  Psychosocial Assessment  Patient Complaints Irritability  Eye Contact Avoids  Facial Expression Flat  Affect Flat  Speech Logical/coherent  Interaction Avoidant  Motor Activity Slow  Appearance/Hygiene Unremarkable  Behavior Characteristics Unwilling to participate  Mood Preoccupied  Thought Process  Coherency Unable to assess  Content UTA  Delusions UTA  Perception UTA  Hallucination UTA  Judgment Poor  Confusion None  Danger to Self  Current suicidal ideation? Denies  Danger to Others  Danger to Others None reported or observed   Dar Note: Patient presents with irritable affect and mood.  Denies suicidal thoughts, auditory and visual hallucinations. Needed several prompts for his morning medication.  Only got up this afternoon for lunch before taking his morning medications.  Patient is withdrawn and isolative to his room.  Routine safety checks maintained.  Refused to attend any groups when invited.

## 2024-09-17 NOTE — BHH Group Notes (Signed)
 Adult Psychoeducational Group Note  Date:  09/17/2024 Time:  11:38 AM  Group Topic/Focus: Recreation Therapy  Participation Level:  Did Not Attend  Participation Quality:    Affect:    Cognitive:    Insight:   Engagement in Group:    Modes of Intervention:    Additional Comments:    Brad Walters 09/17/2024, 11:38 AM
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
## 2024-09-17 NOTE — BH IP Treatment Plan (Signed)
 Interdisciplinary Treatment and Diagnostic Plan Update  09/17/2024 Time of Session: 11:40 AM - UPDATE Stancil Deisher MRN: [id number]  Principal Diagnosis: Psychosis (HCC)  Secondary Diagnoses: Principal Problem:   Psychosis (HCC) Active Problems:   Methamphetamine use disorder, severe (HCC)   Stimulant use disorder   Current Medications:  Current Facility-Administered Medications  Medication Dose Route Frequency Provider Last Rate Last Admin   acetaminophen  (TYLENOL ) tablet 650 mg  650 mg Oral Q6H PRN Mannie Jerel PARAS, NP   650 mg at 09/11/24 0303   alum & mag hydroxide-simeth (MAALOX/MYLANTA) 200-200-20 MG/5ML suspension 30 mL  30 mL Oral Q4H PRN Mannie Jerel PARAS, NP       haloperidol  (HALDOL ) tablet 5 mg  5 mg Oral TID PRN Mannie Jerel PARAS, NP       And   diphenhydrAMINE  (BENADRYL ) capsule 50 mg  50 mg Oral TID PRN Mannie Jerel PARAS, NP       haloperidol  lactate (HALDOL ) injection 10 mg  10 mg Intramuscular TID PRN Mannie Jerel PARAS, NP       And   diphenhydrAMINE  (BENADRYL ) injection 50 mg  50 mg Intramuscular TID PRN Mannie Jerel PARAS, NP       And   LORazepam  (ATIVAN ) injection 2 mg  2 mg Intramuscular TID PRN Mannie Jerel PARAS, NP       haloperidol  lactate (HALDOL ) injection 5 mg  5 mg Intramuscular TID PRN Mannie Jerel PARAS, NP   5 mg at 09/16/24 1055   And   diphenhydrAMINE  (BENADRYL ) injection 50 mg  50 mg Intramuscular TID PRN Mannie Jerel PARAS, NP   50 mg at 09/16/24 1055   And   LORazepam  (ATIVAN ) injection 2 mg  2 mg Intramuscular TID PRN Mannie Jerel PARAS, NP   2 mg at 09/16/24 1055   magnesium  hydroxide (MILK OF MAGNESIA) suspension 30 mL  30 mL Oral Daily PRN Mannie Jerel PARAS, NP       nicotine  (NICODERM CQ  - dosed in mg/24 hours) patch 14 mg  14 mg Transdermal Daily Prentis Kitchens A, DO   14 mg at 09/17/24 1248   OLANZapine  (ZYPREXA ) tablet 10 mg  10 mg Oral TID Faunce, Alina, DO   10 mg at 09/17/24 1248   PTA Medications: No medications prior to admission.     Patient Stressors: Health problems   Substance abuse    Patient Strengths: Ability for insight  Communication skills   Treatment Modalities: Medication Management, Group therapy, Case management,  1 to 1 session with clinician, Psychoeducation, Recreational therapy.   Physician Treatment Plan for Primary Diagnosis: Psychosis (HCC) Long Term Goal(s):     Short Term Goals: Ability to demonstrate self-control will improve Ability to identify triggers associated with substance abuse/mental health issues will improve  Medication Management: Evaluate patient's response, side effects, and tolerance of medication regimen.  Therapeutic Interventions: 1 to 1 sessions, Unit Group sessions and Medication administration.  Evaluation of Outcomes: Progressing  Physician Treatment Plan for Secondary Diagnosis: Principal Problem:   Psychosis (HCC) Active Problems:   Methamphetamine use disorder, severe (HCC)   Stimulant use disorder  Long Term Goal(s):     Short Term Goals: Ability to demonstrate self-control will improve Ability to identify triggers associated with substance abuse/mental health issues will improve     Medication Management: Evaluate patient's response, side effects, and tolerance of medication regimen.  Therapeutic Interventions: 1 to 1 sessions, Unit Group sessions and Medication administration.  Evaluation of Outcomes: Progressing   RN Treatment  Plan for Primary Diagnosis: Psychosis (HCC) Long Term Goal(s): Knowledge of disease and therapeutic regimen to maintain health will improve  Short Term Goals: Ability to remain free from injury will improve, Ability to verbalize frustration and anger appropriately will improve, Ability to verbalize feelings will improve, and Ability to disclose and discuss suicidal ideas  Medication Management: RN will administer medications as ordered by provider, will assess and evaluate patient's response and provide education to  patient for prescribed medication. RN will report any adverse and/or side effects to prescribing provider.  Therapeutic Interventions: 1 on 1 counseling sessions, Psychoeducation, Medication administration, Evaluate responses to treatment, Monitor vital signs and CBGs as ordered, Perform/monitor CIWA, COWS, AIMS and Fall Risk screenings as ordered, Perform wound care treatments as ordered.  Evaluation of Outcomes: Progressing   LCSW Treatment Plan for Primary Diagnosis: Psychosis (HCC) Long Term Goal(s): Safe transition to appropriate next level of care at discharge, Engage patient in therapeutic group addressing interpersonal concerns.  Short Term Goals: Engage patient in aftercare planning with referrals and resources, Increase ability to appropriately verbalize feelings, Facilitate acceptance of mental health diagnosis and concerns, and Identify triggers associated with mental health/substance abuse issues  Therapeutic Interventions: Assess for all discharge needs, 1 to 1 time with Social worker, Explore available resources and support systems, Assess for adequacy in community support network, Educate family and significant other(s) on suicide prevention, Complete Psychosocial Assessment, Interpersonal group therapy.  Evaluation of Outcomes: Progressing   Progress in Treatment: Attending groups: No. Participating in groups: No. Taking medication as prescribed: Yes. Toleration medication: Yes. Family/Significant other contact made: Yes, contacted Kasir Hallenbeck (mom) [phone] and [name] (brother)  [phone] Patient understands diagnosis: No. Discussing patient identified problems/goals with staff: Yes. Medical problems stabilized or resolved: Yes. Denies suicidal/homicidal ideation: Yes. Issues/concerns per patient self-inventory: No.   New problem(s) identified: No, Describe:  none   New Short Term/Long Term Goal(s): detox, medication management for mood stabilization;  elimination of SI thoughts; development of comprehensive mental wellness/sobriety plan     Patient Goals:  Getting rest   Discharge Plan or Barriers: Patient recently admitted. CSW will continue to follow and assess for appropriate referrals and possible discharge planning.      Reason for Continuation of Hospitalization: Medication stabilization Other; describe paranoia   Estimated Length of Stay: 3 - 4 days  Last 3 [place] Suicide Severity Risk Score: Flowsheet Row [facility]Current[facility] Most recent reading at 09/10/2024  7:00 PM ED from 09/09/2024 in [facility] Emergency Department at [facility] Most recent reading at 09/09/2024 11:41 PM ED from 09/09/2024 in [facility] Most recent reading at 09/09/2024  1:48 PM  C-SSRS RISK CATEGORY No Risk No Risk No Risk    Last PHQ 2/9 Scores:     No data to display          Scribe for Treatment Team: Gifford Ballon O Lailoni Baquera, LCSWA 09/17/2024 1:46 PM

## 2024-09-17 NOTE — BHH Group Notes (Signed)
 Adult Psychoeducational Group Note  Date:  09/17/2024 Time:  12:50 PM  Group Topic/Focus: Physical Wellness  Participation Level:  Did Not Attend  Participation Quality:    Affect:    Cognitive:    Insight:   Engagement in Group:    Modes of Intervention:    Additional Comments:    Brad Walters 09/17/2024, 12:50 PM

## 2024-09-17 NOTE — BHH Group Notes (Signed)
 Patient did not attend Rn group.

## 2024-09-17 NOTE — Group Note (Signed)
 Recreation Therapy Group Note   Group Topic:Self-Esteem  Group Date: 09/17/2024 Start Time: 1036 End Time: 1056 Facilitators: Antron Seth-McCall, LRT,CTRS Location: [address] Dayroom   Group Topic: Self-Esteem  Goal Area(s) Addresses:  Patient will successfully identify positive attributes about themselves.  Patient will identify healthy ways to increase self-esteem. Patient will acknowledge benefit(s) of improved self-esteem.   Behavioral Response:   Intervention: Markers, Outline of a mirror  Activity: Picture Yourself. LRT spoke with patients about the importance of self esteem. Patients were given an outline of a blank mirror and asked to envision looking at themselves. As patients looked into the mirror, patients were to identify the positive things they see in themselves and write/draw it in the mirror.  Education:  Self-Esteem, Discharge Planning  Education Outcome: Acknowledges education/In group clarification offered/Needs additional education   Affect/Mood: N/A   Participation Level: Did not attend    Clinical Observations/Individualized Feedback:      Plan: Continue to engage patient in RT group sessions 2-3x/week.   Dewanda Fennema-McCall, LRT,CTRS 09/17/2024 1:06 PM

## 2024-09-17 NOTE — BHH Group Notes (Signed)
 Adult Psychoeducational Group Note  Date:  09/17/2024 Time:  2:07 PM  Group Topic/Focus: Chaplain Group  Participation Level:  Did Not Attend  Participation Quality:    Affect:    Cognitive:    Insight:   Engagement in Group:    Modes of Intervention:    Additional Comments:    Brad Walters 09/17/2024, 2:07 PM

## 2024-09-17 NOTE — Group Note (Signed)
 Date:  09/17/2024 Time:  6:09 PM  Group Topic/Focus:  Importance of treatment compliance    Participation Level:  Did Not Attend    Almarie MALVA Lowers 09/17/2024, 6:09 PM

## 2024-09-17 NOTE — Group Note (Unsigned)
 Date:  09/17/2024 Time:  8:26 PM  Group Topic/Focus:  Wrap-Up Group:   The focus of this group is to help patients review their daily goal of treatment and discuss progress on daily workbooks.     Participation Level:  {BHH PARTICIPATION OZCZO:77735}  Participation Quality:  {BHH PARTICIPATION QUALITY:22265}  Affect:  {BHH AFFECT:22266}  Cognitive:  {BHH COGNITIVE:22267}  Insight: {BHH Insight2:20797}  Engagement in Group:  {BHH ENGAGEMENT IN HMNLE:77731}  Modes of Intervention:  {BHH MODES OF INTERVENTION:22269}  Additional Comments:  ***  Gwenn Nobie [place] 09/17/2024, 8:26 PM

## 2024-09-17 NOTE — Plan of Care (Signed)
   Problem: Education: Goal: Emotional status will improve Outcome: Not Progressing Goal: Mental status will improve Outcome: Not Progressing

## 2024-09-18 MED ORDER — NICOTINE POLACRILEX 2 MG MT GUM
2.0000 mg | CHEWING_GUM | OROMUCOSAL | Status: DC | PRN
  Administered 2024-09-18 – 2024-09-19 (×2): 2 mg via ORAL

## 2024-09-18 NOTE — BHH Group Notes (Signed)
 Adult Psychoeducational Group Note  Date:  09/18/2024 Time:  1:19 PM  Group Topic/Focus: Social Work Group  Participation Level:  Did Not Attend  Participation Quality:    Affect:    Cognitive:    Insight:   Engagement in Group:    Modes of Intervention:    Additional Comments:    Elija Mccamish O 09/18/2024, 1:19 PM

## 2024-09-18 NOTE — Plan of Care (Signed)
   Problem: Education: Goal: Knowledge of [place] [facility] Education information/materials will improve Outcome: Progressing Goal: Emotional status will improve Outcome: Progressing Goal: Mental status will improve Outcome: Progressing Goal: Verbalization of understanding the information provided will improve Outcome: Progressing

## 2024-09-18 NOTE — BHH Group Notes (Signed)
 Adult Psychoeducational Group Note  Date:  09/18/2024 Time:  10:58 AM  Group Topic/Focus: Intellectual Wellness  Participation Level:  Did Not Attend  Participation Quality:    Affect:    Cognitive:    Insight:   Engagement in Group:    Modes of Intervention:    Additional Comments:    Brad Walters 09/18/2024, 10:58 AM

## 2024-09-18 NOTE — Plan of Care (Signed)
   Problem: Education: Goal: Emotional status will improve Outcome: Progressing Goal: Mental status will improve Outcome: Progressing Goal: Verbalization of understanding the information provided will improve Outcome: Progressing

## 2024-09-18 NOTE — Group Note (Signed)
 Date:  09/18/2024 Time:  8:16 PM  Group Topic/Focus:  Wrap-Up Group:   The focus of this group is to help patients review their daily goal of treatment and discuss progress on daily workbooks.    Participation Level:  Did Not Attend   Teghan Philbin Dacosta 09/18/2024, 8:16 PM

## 2024-09-18 NOTE — BH Assessment (Signed)
(  Sleep Hours) - 9.5 (Any PRNs that were needed, meds refused, or side effects to meds)-  (Any disturbances and when (visitation, over night)- None (Concerns raised by the patient)- Pt became agitatied prior to breakfast saying; My kids aren't safe!Give me a shot. Pt was redirected and advised to go to his room. Pt then went for breakfast. (SI/HI/AVH)- Denies '

## 2024-09-18 NOTE — BHH Group Notes (Signed)
 Adult Psychoeducational Group Note  Date:  09/18/2024 Time:  1:18 PM  Group Topic/Focus: Emotional Wellness Wellness Toolbox:   The focus of this group is to discuss various aspects of wellness, balancing those aspects and exploring ways to increase the ability to experience wellness.  Patients will create a wellness toolbox for use upon discharge.  Participation Level:  Did Not Attend  Participation Quality:    Affect:    Cognitive:    Insight:   Engagement in Group:    Modes of Intervention:    Additional Comments:    Annett Berle Hoyer 09/18/2024, 1:18 PM

## 2024-09-18 NOTE — Group Note (Signed)
 LCSW Group Therapy Note  Group Date: 09/18/2024 Start Time: 1000 End Time: 1030   Type of Therapy and Topic:  Group Therapy: Strengths  Participation Level:  Did Not Attend   Description of Group:   This group addressed individual strengths towards self and others.  Patients went around the room and identified two strengths about themselves and strengths of others. Patients reflected on how it felt to share something positive with others and to identify strengths about themselves. Patients were encouraged to identify strengths, positive characteristics or circumstances.   Therapeutic Goals: Patients will verbalize two of their strengths Patients will demonstrate empathy for others by stating two positive qualities of others. Patients will discuss the us  of their strengths for wellness/recovery of focusing on positive traits of self and others.  Summary of Patient Progress:   Pt did not participate in Social Work group  Therapeutic Modalities:   Engineer, Manufacturing Systems Therapy Motivational Interviewing   [place], [place] 09/18/2024  10:50 AM

## 2024-09-18 NOTE — Progress Notes (Signed)
 [facility] Inpatient Psychiatry Progress Note  Date: 09/18/2024 Patient: Brad Walters MRN: [id number]  ASSESSMENT: Patient is a 38 year old male with a history of polysubstance use presenting involuntarily after recent arrest for trespassing at his children's mother's home. He was reported to be responding to internal stimuli and making homicidal threats. He was brought to [facility] by his brother for inpatient psychiatric care. His carries delusions that he lives the in the home that he was trespassing at, and has held these delusions for a year and a half and goes there frequently out of concern that his children are being abused there. Given the extended time-line, the paranoia, the delusions, and the reported hallucinations, suspect primary schizophrenia with co-morbid substance use disorder.  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 He was able to control his temper, though is still psychotic. Reports good sleep and appetite. Denies SI, HI, AH, VH, or any physical complaints. He has not been attending groups.  PLAN: Psychiatric Diagnoses and Treatment # Schizophrenia - continue Zyprexa  10 mg TID  - If patient does not come to window for meds, please wake him from his room and administer in his room. He reports that he is willing to take the medications.   PRN's - Trazodone  50 mg at bedtime as needed for insomnia - Atarax  25 mg TID as needed for anxiety - Agitation Protocol: haldol  + ativan  + benadryl    2. Active Medical Issues #Nicotine  withdrawal - Patient in need of nicotine  replacement; nicotine  patch 14 mg / 24 hours ordered. Smoking cessation encouraged   Other as needed medications  Tylenol  650 mg every 6 hours as needed for pain Mylanta 30 mL every 4 hours as  needed for indigestion Milk of magnesia 30 mL daily as needed for constipation  Risk Assessment: Patient continues to require inpatient hospitalization for safety and stabilization of acute psychosis and paranoia.  Discharge Planning: Barriers to Discharge: acute psychosis, paranoia Estimated Length of Stay: 1-3 days Predicted Discharge Location: TBD  INTERVAL HISTORY: Chart reviewed. No significant events overnight.  He refused his third dose of olanzapine  yesterday.  On interview, patient explains that he was taken by police for trespassing incorrectly because he believes that he lives at that home.  He goes there often has been going there regularly over the last year and a half because he is concerned that his kids are being abused.  He says that he still lives there and that the trespassing order is wrong.  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  I expressed multiple times that this concern has been ongoing for at least a year and a half and that he has been making these trips to his children's mother's home for the last year and a half.  PRN's in the last 24 hours include: none  Physical Examination:  Vitals and nursing note reviewed MSK: Normal gait and station  MENTAL STATUS EXAM:  Mental Status Exam  Apperance: Appropriate for environment, Laying in bed, and Sitting upright Behavior: Calm Speech: Fast (not pressured), Articulate, Normal Volume, and Responsive Attitude: Cooperative Mood: fine Affect: Euthymic, BLUNTED, and Mood Congruent Perception: Not responding to internal stimuli Thought Content: within normal limits and Delusions: Paranoid delusions that his kids are being abused by their mother.  He no longer has custody of these kids. Also delusions that he lives in that house that he has been trespassed from. Thought Form: Goal Directed and PERSEVERATION Cognition: Alert & Oriented to person, place, and time and  Immediate memory grossly intact by interview Judgment: POOR Insight: POOR  Lab Results:  No visits with results within 1 Day(s) from this visit.  Latest known visit with results is:  Admission on 09/09/2024, Discharged on 09/10/2024  Component Date Value Ref Range Status   WBC 09/09/2024 6.0  4.0 - 10.5 K/uL Final   RBC 09/09/2024 4.37  4.22 - 5.81 MIL/uL Final   Hemoglobin 09/09/2024 13.4  13.0 - 17.0 g/dL Final   HCT 88/72/7974 40.2  39.0 - 52.0 % Final   MCV 09/09/2024 92.0  80.0 - 100.0 fL Final   MCH 09/09/2024 30.7  26.0 - 34.0 pg Final   MCHC 09/09/2024 33.3  30.0 - 36.0 g/dL Final   RDW 88/72/7974 12.8  11.5 - 15.5 % Final   Platelets 09/09/2024 242  150 - 400 K/uL Final   nRBC 09/09/2024 0.0  0.0 - 0.2 % Final   Neutrophils Relative % 09/09/2024 46  % Final   Neutro Abs 09/09/2024 2.8  1.7 - 7.7 K/uL Final   Lymphocytes Relative 09/09/2024 39  % Final   Lymphs Abs 09/09/2024 2.4  0.7 - 4.0 K/uL Final   Monocytes Relative 09/09/2024 9  % Final   Monocytes Absolute 09/09/2024 0.6  0.1 - 1.0 K/uL Final   Eosinophils Relative 09/09/2024 5  % Final   Eosinophils Absolute 09/09/2024 0.3  0.0 - 0.5 K/uL Final   Basophils Relative 09/09/2024 1  % Final   Basophils Absolute 09/09/2024 0.0  0.0 - 0.1 K/uL Final   Immature Granulocytes 09/09/2024 0  % Final   Abs Immature Granulocytes 09/09/2024 0.01  0.00 - 0.07 K/uL Final   Sodium 09/09/2024 138  135 - 145 mmol/L Final   Potassium 09/09/2024 3.5  3.5 - 5.1 mmol/L Final   Chloride 09/09/2024 102  98 - 111 mmol/L Final   CO2 09/09/2024 26  22 - 32 mmol/L Final   Glucose, Bld 09/09/2024 126 (H)  70 - 99 mg/dL Final   BUN 88/72/7974 14  6 - 20 mg/dL Final   Creatinine, Ser 09/09/2024 1.26 (H)  0.61 - 1.24 mg/dL Final   Calcium 88/72/7974 8.8 (L)  8.9 - 10.3 mg/dL Final   Total Protein 88/72/7974 6.0 (L)  6.5 - 8.1 g/dL Final   Albumin 88/72/7974 3.5  3.5 - 5.0 g/dL Final   AST 88/72/7974 17  15 - 41 U/L Final   ALT 09/09/2024 12   0 - 44 U/L Final   Alkaline Phosphatase 09/09/2024 35 (L)  38 - 126 U/L Final   Total Bilirubin 09/09/2024 0.6  0.0 - 1.2 mg/dL Final   GFR, Estimated 09/09/2024 >60  >60 mL/min Final   Anion gap 09/09/2024 10  5 - 15 Final   Magnesium  09/09/2024 1.9  1.7 - 2.4 mg/dL Final   Glucose-Capillary 09/10/2024 116 (H)  70 - 99 mg/dL Final   Opiates 88/71/7974 NONE DETECTED  NONE DETECTED Final   Cocaine 09/10/2024 NONE DETECTED  NONE DETECTED Final   Benzodiazepines 09/10/2024 NONE DETECTED  NONE DETECTED Final   Amphetamines 09/10/2024 POSITIVE (A)  NONE DETECTED Final   Tetrahydrocannabinol 09/10/2024 NONE DETECTED  NONE DETECTED Final   Barbiturates 09/10/2024 NONE DETECTED  NONE DETECTED Final   Alcohol, Ethyl (B) 09/09/2024 <15  <15  mg/dL Final   Lactic Acid, Venous 09/10/2024 1.5  0.5 - 1.9 mmol/L Final   Total CK 09/10/2024 90  49 - 397 U/L Final   Acetaminophen  (Tylenol ), Serum 09/10/2024 <10 (L)  10 - 30 ug/mL Final   Salicylate Lvl 09/10/2024 <7.0 (L)  7.0 - 30.0 mg/dL Final     Vitals: Blood pressure 114/68, pulse 82, temperature 97.7 F (36.5 C), temperature source Oral, resp. rate 14, height 5' 7 (1.702 m), weight 60.3 kg, SpO2 98%.   Penne Mori, DO, PGY1 [facility] Psychiat[facility] 09/18/2024, 1:13 PM

## 2024-09-18 NOTE — BHH Group Notes (Signed)
 Adult Psychoeducational Group Note  Date:  09/18/2024 Time:  9:26 AM  Group Topic/Focus: Goals Group Goals Group:   The focus of this group is to help patients establish daily goals to achieve during treatment and discuss how the patient can incorporate goal setting into their daily lives to aide in recovery.  Participation Level:  Did Not Attend  Participation Quality:    Affect:    Cognitive:    Insight:   Engagement in Group:    Modes of Intervention:    Additional Comments:    Annett Berle Hoyer 09/18/2024, 9:26 AM

## 2024-09-18 NOTE — BHH Group Notes (Signed)
 Adult Psychoeducational Group Note  Date:  09/18/2024 Time:  1:20 PM  Group Topic/Focus: Emotional Wellness Wellness Toolbox:   The focus of this group is to discuss various aspects of wellness, balancing those aspects and exploring ways to increase the ability to experience wellness.  Patients will create a wellness toolbox for use upon discharge.  Participation Level:  Did Not Attend  Participation Quality:    Affect:    Cognitive:    Insight:   Engagement in Group:    Modes of Intervention:    Additional Comments:    Annett Redman Ramsay 09/18/2024, 1:20 PM

## 2024-09-19 NOTE — Group Note (Signed)
 Date:  09/19/2024 Time:  11:32 AM  Group Topic/Focus:  Wellness Toolbox:   The focus of this group is to discuss various aspects of wellness, balancing those aspects and exploring ways to increase the ability to experience wellness.  Patients will create a wellness toolbox for use upon discharge.    Participation Level:  Minimal  Participation Quality:  Intrusive and Inattentive  Affect:  Labile  Cognitive:  Alert  Insight: Lacking  Engagement in Group:  Distracting and Off Topic  Modes of Intervention:  Discussion  Additional Comments:    Brad Walters [place] 09/19/2024, 11:32 AM

## 2024-09-19 NOTE — Group Note (Signed)
 Date:  09/19/2024 Time:  8:10 PM  Group Topic/Focus:  Wrap-Up Group:   The focus of this group is to help patients review their daily goal of treatment and discuss progress on daily workbooks.    Participation Level:  Did Not Attend  Participation Quality:  Appropriate  Affect:  Appropriate  Cognitive:  Appropriate  Insight: Appropriate  Engagement in Group:  Engaged  Modes of Intervention:  Education and Exploration  Additional Comments:  Patient attended and participated in group tonight. He reports that today was his birthday. His goal was to be happy today. His brother came by today and brought him som slippers and toiletries.  Gwenn Chillington Dacosta 09/19/2024, 8:10 PM

## 2024-09-19 NOTE — Plan of Care (Signed)
   Problem: Education: Goal: Emotional status will improve Outcome: Not Progressing Goal: Mental status will improve Outcome: Not Progressing   Problem: Activity: Goal: Interest or engagement in activities will improve Outcome: Not Progressing

## 2024-09-19 NOTE — Plan of Care (Signed)
   Problem: Education: Goal: Knowledge of [place] [facility] Education information/materials will improve Outcome: Progressing Goal: Emotional status will improve Outcome: Progressing Goal: Mental status will improve Outcome: Progressing Goal: Verbalization of understanding the information provided will improve Outcome: Progressing

## 2024-09-19 NOTE — BHH Group Notes (Signed)
 Adult Psychoeducational Group Note  Date:  09/19/2024 Time:  3:45 PM  Group Topic/Focus: Social Wellness Developing a Wellness Toolbox:   The focus of this group is to help patients develop a wellness toolbox with skills and strategies to promote recovery upon discharge.  Participation Level:  Active  Participation Quality:  Appropriate  Affect:  Appropriate  Cognitive:  Appropriate  Insight: Good  Engagement in Group:  Engaged  Modes of Intervention:  Discussion  Additional Comments:    Annett Berle Hoyer 09/19/2024, 3:45 PM

## 2024-09-19 NOTE — Progress Notes (Signed)
 [facility] Inpatient Psychiatry Progress Note  Date: 09/19/2024 Patient: Brad Walters MRN: [id number]  ASSESSMENT: Patient is a 38 year old male with a history of polysubstance use presenting involuntarily after recent arrest for trespassing at his children's mother's home. He was reported to be responding to internal stimuli and making homicidal threats. He was brought to [facility] by his brother for inpatient psychiatric care. His carries delusions that he lives the in the home that he was trespassing at, and has held these delusions for a year and a half and goes there frequently out of concern that his children are being abused there. Given the extended time-line, the paranoia, the delusions, and the reported hallucinations, suspect primary schizophrenia with co-morbid substance use disorder.  12/7: Brad Walters was the first day patient received the full dose of Olanzapine  30mg  which has been deterred by compliance issues. There is no significant change in his demeanor today, we may be approaching baseline which could be agitated vs some antisocial characteristics. No change to medication plan today, but encouraged continuing to be active in groups and being outside of his room as he has spent most of the day there. Could consider augmenting Olanzapine  with a first generation antipsychotic based on whether or not his behavior over the next day could be appropriate in the outpatient setting.  PLAN: Psychiatric Diagnoses and Treatment # Schizophrenia - continue Zyprexa  10 mg TID   PRN's - Trazodone  50 mg at bedtime as needed for insomnia - Atarax  25 mg TID as needed for anxiety - Agitation Protocol: haldol  + ativan  + benadryl    2. Active Medical Issues #Nicotine  withdrawal - Patient in need of nicotine  replacement; nicotine  patch 14 mg / 24 hours ordered. Smoking cessation encouraged   Other as needed medications  Tylenol  650 mg every 6 hours as needed for  pain Mylanta 30 mL every 4 hours as needed for indigestion Milk of magnesia 30 mL daily as needed for constipation  Risk Assessment: Patient continues to require inpatient hospitalization for safety and stabilization of acute psychosis and paranoia.  Discharge Planning: Barriers to Discharge: acute psychosis, paranoia Estimated Length of Stay: 1-3 days Predicted Discharge Location: TBD  INTERVAL HISTORY:  12/7 Since yesterday's progress note: There were no acute events, Vital signs have been stable, patient has been compliant with medications, and he required no PRNs. He spent 79% of his time in his room over the last 24 hours.   On interview: Pt is upset that he has to be here on his birthday which is today. He had plans with family members and the plans have had to change and he is very discharge focused. Denies SI, HI, AH, VH, and physical side effects to his medications.  Physical Examination:  Vitals and nursing note reviewed MSK: Normal gait and station  MENTAL STATUS EXAM:  Mental Status Exam  Apperance: Appropriate for environment, Laying in bed, and Sitting upright Behavior: IRRITATED Speech: Fast (not pressured), Articulate, Normal Volume, and Responsive Attitude: Cooperative Mood: upset Affect: Euthymic, BLUNTED, and Mood Congruent Perception: Not responding to internal stimuli Thought Content: within normal limits and Delusions: Paranoid delusions that his kids are being abused by their mother. He no longer has custody of these kids. Also delusions that he lives in that house that he has been trespassed from. Thought Form: Goal Directed and PERSEVERATION Cognition: Alert & Oriented to person, place, and time and Immediate memory grossly intact by interview Judgment: POOR Insight: POOR  Lab Results:  No visits with results  within 1 Day(s) from this visit.  Latest known visit with results is:  Admission on 09/09/2024, Discharged on 09/10/2024  Component Date  Value Ref Range Status   WBC 09/09/2024 6.0  4.0 - 10.5 K/uL Final   RBC 09/09/2024 4.37  4.22 - 5.81 MIL/uL Final   Hemoglobin 09/09/2024 13.4  13.0 - 17.0 g/dL Final   HCT 88/72/7974 40.2  39.0 - 52.0 % Final   MCV 09/09/2024 92.0  80.0 - 100.0 fL Final   MCH 09/09/2024 30.7  26.0 - 34.0 pg Final   MCHC 09/09/2024 33.3  30.0 - 36.0 g/dL Final   RDW 88/72/7974 12.8  11.5 - 15.5 % Final   Platelets 09/09/2024 242  150 - 400 K/uL Final   nRBC 09/09/2024 0.0  0.0 - 0.2 % Final   Neutrophils Relative % 09/09/2024 46  % Final   Neutro Abs 09/09/2024 2.8  1.7 - 7.7 K/uL Final   Lymphocytes Relative 09/09/2024 39  % Final   Lymphs Abs 09/09/2024 2.4  0.7 - 4.0 K/uL Final   Monocytes Relative 09/09/2024 9  % Final   Monocytes Absolute 09/09/2024 0.6  0.1 - 1.0 K/uL Final   Eosinophils Relative 09/09/2024 5  % Final   Eosinophils Absolute 09/09/2024 0.3  0.0 - 0.5 K/uL Final   Basophils Relative 09/09/2024 1  % Final   Basophils Absolute 09/09/2024 0.0  0.0 - 0.1 K/uL Final   Immature Granulocytes 09/09/2024 0  % Final   Abs Immature Granulocytes 09/09/2024 0.01  0.00 - 0.07 K/uL Final   Sodium 09/09/2024 138  135 - 145 mmol/L Final   Potassium 09/09/2024 3.5  3.5 - 5.1 mmol/L Final   Chloride 09/09/2024 102  98 - 111 mmol/L Final   CO2 09/09/2024 26  22 - 32 mmol/L Final   Glucose, Bld 09/09/2024 126 (H)  70 - 99 mg/dL Final   BUN 88/72/7974 14  6 - 20 mg/dL Final   Creatinine, Ser 09/09/2024 1.26 (H)  0.61 - 1.24 mg/dL Final   Calcium 88/72/7974 8.8 (L)  8.9 - 10.3 mg/dL Final   Total Protein 88/72/7974 6.0 (L)  6.5 - 8.1 g/dL Final   Albumin 88/72/7974 3.5  3.5 - 5.0 g/dL Final   AST 88/72/7974 17  15 - 41 U/L Final   ALT 09/09/2024 12  0 - 44 U/L Final   Alkaline Phosphatase 09/09/2024 35 (L)  38 - 126 U/L Final   Total Bilirubin 09/09/2024 0.6  0.0 - 1.2 mg/dL Final   GFR, Estimated 09/09/2024 >60  >60 mL/min Final   Anion gap 09/09/2024 10  5 - 15 Final   Magnesium  09/09/2024 1.9   1.7 - 2.4 mg/dL Final   Glucose-Capillary 09/10/2024 116 (H)  70 - 99 mg/dL Final   Opiates 88/71/7974 NONE DETECTED  NONE DETECTED Final   Cocaine 09/10/2024 NONE DETECTED  NONE DETECTED Final   Benzodiazepines 09/10/2024 NONE DETECTED  NONE DETECTED Final   Amphetamines 09/10/2024 POSITIVE (A)  NONE DETECTED Final   Tetrahydrocannabinol 09/10/2024 NONE DETECTED  NONE DETECTED Final   Barbiturates 09/10/2024 NONE DETECTED  NONE DETECTED Final   Alcohol, Ethyl (B) 09/09/2024 <15  <15 mg/dL Final   Lactic Acid, Venous 09/10/2024 1.5  0.5 - 1.9 mmol/L Final   Total CK 09/10/2024 90  49 - 397 U/L Final   Acetaminophen  (Tylenol ), Serum 09/10/2024 <10 (L)  10 - 30 ug/mL Final   Salicylate Lvl 09/10/2024 <7.0 (L)  7.0 - 30.0 mg/dL Final  Vitals: Blood pressure 111/79, pulse 83, temperature 98.2 F (36.8 C), temperature source Oral, resp. rate 14, height 5' 7 (1.702 m), weight 60.3 kg, SpO2 98%.   Penne Mori, DO, PGY1 [facility] Psychiatry 09/19/2024, 12:42 PM

## 2024-09-19 NOTE — BH Assessment (Signed)
(  Sleep Hours) - 8.25 (Any PRNs that were needed, meds refused, or side effects to meds)-  (Any disturbances and when (visitation, over night)- None (Concerns raised by the patient)- Pt said his B'day is today and he will be louder than normal. (SI/HI/AVH)- Denies

## 2024-09-19 NOTE — Progress Notes (Signed)
   09/19/24 0936  Psych Admission Type (Psych Patients Only)  Admission Status Involuntary  Psychosocial Assessment  Patient Complaints Anger  Eye Contact Fair  Facial Expression Angry  Affect Angry;Preoccupied  Speech Aggressive;Argumentative;Pressured  Interaction Minimal;Guarded  Motor Activity Other (Comment) (WDL)  Appearance/Hygiene Unremarkable  Behavior Characteristics Agitated;Irritable  Mood Angry;Preoccupied  Thought Process  Coherency WDL  Content Blaming others;Preoccupation  Delusions None reported or observed  Perception WDL  Hallucination None reported or observed  Judgment Poor  Confusion None  Danger to Self  Current suicidal ideation? Denies  Agreement Not to Harm Self Yes  Description of Agreement Verbal  Danger to Others  Danger to Others None reported or observed

## 2024-09-19 NOTE — Progress Notes (Signed)
(  Sleep Hours) - (Any PRNs that were needed, meds refused, or side effects to meds)- none (Any disturbances and when (visitation, over night)- none (Concerns raised by the patient)- none (SI/HI/AVH)- denies

## 2024-09-19 NOTE — Plan of Care (Signed)
   Problem: Education: Goal: Knowledge of [place] [facility] Education information/materials will improve Outcome: Progressing Goal: Emotional status will improve Outcome: Progressing Goal: Mental status will improve Outcome: Progressing

## 2024-09-19 NOTE — BHH Group Notes (Signed)
 Adult Psychoeducational Group Note  Date:  09/19/2024 Time:  3:47 PM  Group Topic/Focus: Social Wellness Developing a Wellness Toolbox:   The focus of this group is to help patients develop a wellness toolbox with skills and strategies to promote recovery upon discharge.  Participation Level:  Active  Participation Quality:  Appropriate  Affect:  Appropriate  Cognitive:  Appropriate  Insight: Good  Engagement in Group:  Engaged  Modes of Intervention:  Discussion  Additional Comments:    Annett Berle Hoyer 09/19/2024, 3:47 PM

## 2024-09-19 NOTE — BHH Group Notes (Signed)
 Adult Psychoeducational Group Note  Date:  09/19/2024 Time:  9:39 AM  Group Topic/Focus: Goals Group Goals Group:   The focus of this group is to help patients establish daily goals to achieve during treatment and discuss how the patient can incorporate goal setting into their daily lives to aide in recovery.  Participation Level:  Did Not Attend  Participation Quality:    Affect:    Cognitive:    Insight:   Engagement in Group:    Modes of Intervention:    Additional Comments:    Annett Berle Hoyer 09/19/2024, 9:39 AM

## 2024-09-20 NOTE — Group Note (Signed)
 LCSW Group Therapy Note   Group Date: 09/20/2024 Start Time: 1100 End Time: 1200   Participation:  patient was present and actively participated in the discussion.  Type of Therapy and Topic:  Group Therapy   Title: Finding Balance: Using Wise Mind for Thoughtful Decisions  Objective:  To help participants understand and apply the concept of Delsie Mind to make balanced, thoughtful decisions by integrating emotion and logic.  Goals: Learn the differences between Emotional Mind, Reasonable Mind, and [employer]. Recognize personal signs of Emotional and Reasonable Mind. Practice using [employer] in real-life scenarios.  Therapeutic Modalities: Elements of Dialectical Behavior Therapy (DBT):  Mindfulness (noticing thoughts and emotions without judgment), Emotion Regulation (understanding and managing emotional responses), Distress Tolerance (coping with difficult situations without making them worse), Wise Mind (integrating emotion and reason for balanced decision-making) Elements of Cognitive Behavioral Therapy (CBT):  Identifying automatic thoughts, Challenging cognitive distortions, Using logic to reframe unhelpful thinking patterns  Summary:  This class focused on Wise Mind--DBT's concept of balancing Emotional Mind and Reasonable Mind. We identified when we're in each state and practiced using Wise Mind to respond thoughtfully in real-life situations. By combining emotion and logic, participants can improve decision-making, manage challenges, and enhance relationships.   Taijuan Serviss O Coy Vandoren, LCSWA 09/20/2024  2:11 PM

## 2024-09-20 NOTE — Group Note (Signed)
 Date:  09/20/2024 Time:  8:36 PM  Group Topic/Focus:  Wrap-Up Group:   The focus of this group is to help patients review their daily goal of treatment and discuss progress on daily workbooks.    Participation Level:  Did Not Attend   Brad Walters 09/20/2024, 8:36 PM

## 2024-09-20 NOTE — BHH Group Notes (Addendum)
 Adult Psychoeducational Group Note  Date:  09/20/2024 Time:  11:21 AM  Group Topic/Focus: Physically Wellness   Participation Level:  Did not attend     Culley Hedeen 09/20/2024, 11:21 AM

## 2024-09-20 NOTE — BHH Group Notes (Signed)
 Adult Psychoeducational Group Note  Date:  09/20/2024 Time:  10:54 AM  Group Topic/Focus: Recreational Therapy    Participation Level:  Did Not Attend   Brad Walters 09/20/2024, 10:54 AM

## 2024-09-20 NOTE — BHH Group Notes (Signed)
 Adult Psychoeducational Group Note  Date:  09/20/2024 Time:  2:27 PM  Group Topic/Focus: social work group    Participation Level:  Attended   Brad Walters 09/20/2024, 2:27 PM

## 2024-09-20 NOTE — BHH Group Notes (Signed)
 Adult Psychoeducational Group Note  Date:  09/20/2024 Time:  1:28 PM  Group Topic/Focus: Emmotional Wellness Emotional Education:   The focus of this group is to discuss what feelings/emotions are, and how they are experienced.  Participation Level:  Active  Participation Quality:  Appropriate  Affect:  Appropriate  Cognitive:  Appropriate  Insight: Good  Engagement in Group:  Engaged  Modes of Intervention:  Discussion  Additional Comments:    Annett Berle Hoyer 09/20/2024, 1:28 PM

## 2024-09-20 NOTE — Group Note (Signed)
 Recreation Therapy Group Note   Group Topic:Health and Wellness  Group Date: 09/20/2024 Start Time: 1029 End Time: 1045 Facilitators: Keldan Eplin-McCall, LRT,CTRS Location: [address] Dayroom   Group Topic: Wellness  Goal Area(s) Addresses:  Patient will define components of whole wellness. Patient will verbalize benefit of whole wellness.  Behavioral Response:   Intervention: Music  Activity: Exercise. LRT and patients discussed wellness and its benefits. LRT and patients took turns leading the group in the exercises of their choosing. Patients were to complete what they could, take a break if needed and get water if needed.    Education: Wellness, Building Control Surveyor.   Education Outcome: Acknowledges education/In group clarification offered/Needs additional education.    Affect/Mood: N/A   Participation Level: Did not attend    Clinical Observations/Individualized Feedback:      Plan: Continue to engage patient in RT group sessions 2-3x/week.   Shelonda Saxe-McCall, LRT,CTRS 09/20/2024 12:17 PM

## 2024-09-20 NOTE — BHH Group Notes (Signed)
 Adult Psychoeducational Group Note  Date:  09/20/2024 Time:  9:06 AM  Group Topic/Focus:  Orientation:   The focus of this group is to educate the patient on the purpose and policies of crisis stabilization and provide a format to answer questions about their admission.  The group details unit policies and expectations of patients while admitted.  Participation Level:  Did Not Attend   Brad Walters 09/20/2024, 9:06 AM

## 2024-09-20 NOTE — Progress Notes (Signed)
 [facility] Inpatient Psychiatry Progress Note  Date: 09/20/2024 Patient: Brad Walters MRN: [id number]  ASSESSMENT: Patient is a 38 year old male with a history of polysubstance use presenting involuntarily after recent arrest for trespassing at his children's mother's home. He was reported to be responding to internal stimuli and making homicidal threats. He was brought to [facility] by his brother for inpatient psychiatric care. His carries delusions that he lives the in the home that he was trespassing at, and has held these delusions for a year and a half and goes there frequently out of concern that his children are being abused there. Given the extended time-line, the paranoia, the delusions, and the reported hallucinations, suspect primary schizophrenia with co-morbid substance use disorder.  12/8: Patient was able to demonstrate a pleasant, nonlabile mood with physician and staff and milieu today . No change to medications, discharge home to mothers home tomorrow.   PLAN: Psychiatric Diagnoses and Treatment # Schizophrenia - continue Zyprexa  10 mg TID   PRN's - Trazodone  50 mg at bedtime as needed for insomnia - Atarax  25 mg TID as needed for anxiety - Agitation Protocol: haldol  + ativan  + benadryl    2. Active Medical Issues #Nicotine  withdrawal - Patient in need of nicotine  replacement; nicotine  patch 14 mg / 24 hours ordered. Smoking cessation encouraged   Other as needed medications  Tylenol  650 mg every 6 hours as needed for pain Mylanta 30 mL every 4 hours as needed for indigestion Milk of magnesia 30 mL daily as needed for constipation  Risk Assessment: Patient continues to require inpatient hospitalization for safety and stabilization of acute psychosis and paranoia.  Discharge Planning: Barriers to Discharge: acute psychosis, paranoia Estimated Length of Stay: 1 day Predicted Discharge Location: TBD, likely to his mother's home  INTERVAL  HISTORY: Since yesterday's progress note: There were no acute events, vital signs have been stable, patient has been compliant with medications, and he required no PRNs. Patient has full range affect today and laughs appropriately.  Patient noticed that he is doing outside, though is not a fan of dyspnea.  Is excited to spend time with his friends after his birthday once discharged.   Physical Examination:  Vitals and nursing note reviewed MSK: Normal gait and station  MENTAL STATUS EXAM:  Appearance: Black male wearing paper scrubs sitting up in bed  Behavior: good eye contact  Attitude: easily agitated  Speech: Normal rate, rhythm, tone  Mood: good  Affect: full range  Thought Process: Linear  Thought Content: WNL  SI/HI: Denies  Perceptions: Denies AVH. Not observed responding to internal stimuli.  Judgement: fair  Insight: Poor, improving  Fund of Knowledge: WNL     Lab Results:  No visits with results within 1 Day(s) from this visit.  Latest known visit with results is:  Admission on 09/09/2024, Discharged on 09/10/2024  Component Date Value Ref Range Status   WBC 09/09/2024 6.0  4.0 - 10.5 K/uL Final   RBC 09/09/2024 4.37  4.22 - 5.81 MIL/uL Final   Hemoglobin 09/09/2024 13.4  13.0 - 17.0 g/dL Final   HCT 88/72/7974 40.2  39.0 - 52.0 % Final   MCV 09/09/2024 92.0  80.0 - 100.0 fL Final   MCH 09/09/2024 30.7  26.0 - 34.0 pg Final   MCHC 09/09/2024 33.3  30.0 - 36.0 g/dL Final   RDW 88/72/7974 12.8  11.5 - 15.5 % Final   Platelets 09/09/2024 242  150 - 400 K/uL Final   nRBC 09/09/2024 0.0  0.0 - 0.2 % Final   Neutrophils Relative % 09/09/2024 46  % Final   Neutro Abs 09/09/2024 2.8  1.7 - 7.7 K/uL Final   Lymphocytes Relative 09/09/2024 39  % Final   Lymphs Abs 09/09/2024 2.4  0.7 - 4.0 K/uL Final   Monocytes Relative 09/09/2024 9  % Final   Monocytes Absolute 09/09/2024 0.6  0.1 - 1.0 K/uL Final   Eosinophils Relative 09/09/2024 5  % Final   Eosinophils Absolute  09/09/2024 0.3  0.0 - 0.5 K/uL Final   Basophils Relative 09/09/2024 1  % Final   Basophils Absolute 09/09/2024 0.0  0.0 - 0.1 K/uL Final   Immature Granulocytes 09/09/2024 0  % Final   Abs Immature Granulocytes 09/09/2024 0.01  0.00 - 0.07 K/uL Final   Sodium 09/09/2024 138  135 - 145 mmol/L Final   Potassium 09/09/2024 3.5  3.5 - 5.1 mmol/L Final   Chloride 09/09/2024 102  98 - 111 mmol/L Final   CO2 09/09/2024 26  22 - 32 mmol/L Final   Glucose, Bld 09/09/2024 126 (H)  70 - 99 mg/dL Final   BUN 88/72/7974 14  6 - 20 mg/dL Final   Creatinine, Ser 09/09/2024 1.26 (H)  0.61 - 1.24 mg/dL Final   Calcium 88/72/7974 8.8 (L)  8.9 - 10.3 mg/dL Final   Total Protein 88/72/7974 6.0 (L)  6.5 - 8.1 g/dL Final   Albumin 88/72/7974 3.5  3.5 - 5.0 g/dL Final   AST 88/72/7974 17  15 - 41 U/L Final   ALT 09/09/2024 12  0 - 44 U/L Final   Alkaline Phosphatase 09/09/2024 35 (L)  38 - 126 U/L Final   Total Bilirubin 09/09/2024 0.6  0.0 - 1.2 mg/dL Final   GFR, Estimated 09/09/2024 >60  >60 mL/min Final   Anion gap 09/09/2024 10  5 - 15 Final   Magnesium  09/09/2024 1.9  1.7 - 2.4 mg/dL Final   Glucose-Capillary 09/10/2024 116 (H)  70 - 99 mg/dL Final   Opiates 88/71/7974 NONE DETECTED  NONE DETECTED Final   Cocaine 09/10/2024 NONE DETECTED  NONE DETECTED Final   Benzodiazepines 09/10/2024 NONE DETECTED  NONE DETECTED Final   Amphetamines 09/10/2024 POSITIVE (A)  NONE DETECTED Final   Tetrahydrocannabinol 09/10/2024 NONE DETECTED  NONE DETECTED Final   Barbiturates 09/10/2024 NONE DETECTED  NONE DETECTED Final   Alcohol, Ethyl (B) 09/09/2024 <15  <15 mg/dL Final   Lactic Acid, Venous 09/10/2024 1.5  0.5 - 1.9 mmol/L Final   Total CK 09/10/2024 90  49 - 397 U/L Final   Acetaminophen  (Tylenol ), Serum 09/10/2024 <10 (L)  10 - 30 ug/mL Final   Salicylate Lvl 09/10/2024 <7.0 (L)  7.0 - 30.0 mg/dL Final     Vitals: Blood pressure 108/79, pulse 86, temperature 98.1 F (36.7 C), temperature source Oral,  resp. rate 14, height 5' 7 (1.702 m), weight 60.3 kg, SpO2 98%.   Alfornia Light, DO [facility] 09/20/2024, 7:44 AM

## 2024-09-21 ENCOUNTER — Encounter (HOSPITAL_COMMUNITY): Payer: Self-pay | Admitting: Student in an Organized Health Care Education/Training Program

## 2024-09-21 MED ORDER — NICOTINE 14 MG/24HR TD PT24: 14.0000 mg | MEDICATED_PATCH | Freq: Every day | TRANSDERMAL | 0 refills | Status: AC

## 2024-09-21 MED ORDER — OLANZAPINE 10 MG PO TABS: 10.0000 mg | ORAL_TABLET | Freq: Three times a day (TID) | ORAL | 0 refills | Status: AC

## 2024-09-21 NOTE — Group Note (Deleted)
 Date:  09/21/2024 Time:  10:01 AM  Group Topic/Focus: Pet therapy Pet therapy, or animal-assisted therapy, has become an increasingly popular and effective method for improving mental health in adults. The presence of animals can provide emotional support, reduce stress, and enhance overall well-being.    Participation Level:  {BHH PARTICIPATION OZCZO:77735}  Participation Quality:  {BHH PARTICIPATION QUALITY:22265}  Affect:  {BHH AFFECT:22266}  Cognitive:  {BHH COGNITIVE:22267}  Insight: {BHH Insight2:20797}  Engagement in Group:  {BHH ENGAGEMENT IN HMNLE:77731}  Modes of Intervention:  {BHH MODES OF INTERVENTION:22269}  Additional Comments:  ***  Brad Walters M Brad Walters 09/21/2024, 10:01 AM

## 2024-09-21 NOTE — Progress Notes (Signed)
 Patient was given return to work note.   Jeryn Cerney, LCSWA 09/21/2024

## 2024-09-21 NOTE — BHH Suicide Risk Assessment (Signed)
 BHH INPATIENT:  Family/Significant Other Suicide Prevention Education  Suicide Prevention Education:  Education Completed; with patient,  (name of family member/significant other) has been identified by the patient as the family member/significant other with whom the patient will be residing, and identified as the person(s) who will aid the patient in the event of a mental health crisis (suicidal ideations/suicide attempt).  With written consent from the patient, the family member/significant other has been provided the following suicide prevention education, prior to the and/or following the discharge of the patient.  Patient was given Suicide Prevention Information brochure.  The suicide prevention education provided includes the following: Suicide risk factors Suicide prevention and interventions National Suicide Hotline telephone number [facility] assessment telephone number [facility] Emergency Assistance 911 [facility] and/or Residential Mobile Crisis Unit telephone number  Request made of family/significant other to: Remove weapons (e.g., guns, rifles, knives), all items previously/currently identified as safety concern.   Remove drugs/medications (over-the-counter, prescriptions, illicit drugs), all items previously/currently identified as a safety concern.  The family member/significant other verbalizes understanding of the suicide prevention education information provided.  The family member/significant other agrees to remove the items of safety concern listed above.  Chena Chohan O Rozalyn Osland, LCSWA 09/21/2024, 9:10 AM

## 2024-09-21 NOTE — BHH Suicide Risk Assessment (Signed)
 Suicide Risk Assessment  Discharge Assessment    [facility] Discharge Suicide Risk Assessment   Principal Problem: Psychosis [facility]) Discharge Diagnoses: Principal Problem:   Psychosis (HCC) Active Problems:   Methamphetamine use disorder, severe (HCC)   Stimulant use disorder  Musculoskeletal: Strength & Muscle Tone: within normal limits Gait & Station: normal Patient leans: N/A  Psychiatric Specialty Exam  Presentation  General Appearance:  Appropriate for Environment  Eye Contact: Good  Speech: Normal Rate  Speech Volume: Normal  Handedness:No data recorded  Mood and Affect  Mood: Euthymic  Duration of Depression Symptoms: Greater than two weeks  Affect: Congruent; Appropriate   Thought Process  Thought Processes: Coherent; Goal Directed  Descriptions of Associations:Intact  Orientation:Full (Time, Place and Person)  Thought Content:WDL; Logical  History of Schizophrenia/Schizoaffective disorder:Yes  Duration of Psychotic Symptoms:N/A  Hallucinations:Hallucinations: None  Ideas of Reference:None  Suicidal Thoughts:Suicidal Thoughts: No  Homicidal Thoughts:Homicidal Thoughts: No   Sensorium  Memory: Immediate Fair  Judgment: Fair  Insight: Fair   Art Therapist  Concentration: Fair  Attention Span: Fair  Recall: Fair  Fund of Knowledge: Fair  Language: Fair   Psychomotor Activity  Psychomotor Activity:Psychomotor Activity: Normal   Assets  Assets: Communication Skills; Physical Health; Resilience; Social Support   Sleep  Sleep:Sleep: Fair  Estimated Sleeping Duration (Last 24 Hours): 6.50-8.75 hours  Physical Exam: Physical Exam ROS Blood pressure 114/83, pulse 85, temperature 98.1 F (36.7 C), temperature source Oral, resp. rate 14, height 5' 7 (1.702 m), weight 60.3 kg, SpO2 98%. Body mass index is 20.83 kg/m.  Mental Status Per Nursing Assessment::   On Admission:  NA  Demographic Factors:   Male, Low socioeconomic status, and Unemployed  Loss Factors: Loss of significant relationship and Financial problems/change in socioeconomic status  Historical Factors: Family history of mental illness or substance abuse and Impulsivity  Risk Reduction Factors:   Living with another person, especially a relative, Positive social support, and Positive coping skills or problem solving skills  Continued Clinical Symptoms:  Alcohol/Substance Abuse/Dependencies Schizophrenia:   Less than 30 years old Paranoid or undifferentiated type  Cognitive Features That Contribute To Risk:  None    Suicide Risk:  Minimal: No identifiable suicidal ideation.  Patients presenting with no risk factors but with morbid ruminations; may be classified as minimal risk based on the severity of the depressive symptoms   Follow-up Information     [facility] Follow up on 09/24/2024.   Why: You have a hospital follow up appointment for therapy and medication management services on 09/24/24 at 8:00 am.  The appointment will be Virtual, telehealth. Contact information: [address]  [address] [place] [address] [phone]                 Plan Of Care/Follow-up recommendations:  Activity: as tolerated  Diet: heart healthy  Other: -Follow-up with your outpatient psychiatric provider -instructions on appointment date, time, and address (location) are provided to you in discharge paperwork.  -Take your psychiatric medications as prescribed at discharge - instructions are provided to you in the discharge paperwork  -Recommend abstinence from alcohol, tobacco, and other illicit drug use at discharge.   -If your psychiatric symptoms recur, worsen, or if you have side effects to your psychiatric medications, call your outpatient psychiatric provider, 911, [phone] or go to the nearest emergency department.  -If suicidal thoughts recur, call your outpatient psychiatric provider, 911, 988 or go to  the nearest emergency department.   Alfornia Light, DO 09/21/2024, 8:52 AM

## 2024-09-21 NOTE — Discharge Summary (Signed)
 Physician Discharge Summary Note  Patient:  Brad Walters is an 38 y.o., male MRN:  [id number] DOB:  10/09/1986 Patient phone:  [phone] (home)  Patient address:   [address] Brad Walters,   Date of Admission:  09/10/2024 Date of Discharge: 09/21/2024  Reason for Admission: Brad Walters is a 38 y.o. male  with a past psychiatric history of polysubstance use. Patient initially arrived to [facility] on 09/09/24 seeking methamphetamine detox and was admitted to [facility] under IVC on 09/10/24 for severe substance-induced psychosis or mood disturbances.   Discharge Diagnoses:  Principal Problem:   Psychosis (HCC) Active Problems:   Methamphetamine use disorder, severe (HCC)   Stimulant use disorder  Hospital Course:   Upon admission, the patient presented with psychosis and mood lability in the context of recent methamphetamine use and was continued on/started on the following medications: Zyprexa . Initial medication adjustments included titration of Zyprexa , and baseline labs and imaging were reviewed, with the following abnormalities noted for follow-up: n/a.  The treatment plan was reviewed daily in interdisciplinary meetings. Ongoing medication management resulted in further adjustments: Continued titration of Zyprexa , with final dosing optimized prior to discharge. The patient denies any side effects to prescribed psychiatric medication. The patient engaged in group programming focusing on coping skills, problem-solving, relaxation techniques, and also received supportive psychotherapy.  Over the course of hospitalization, the patient acclimated to the unit milieu and showed steady improvement in mood, affect, sleep, appetite, and participation in programming. Daily self-inventories reflected ongoing symptom reduction and increased treatment engagement.  Patient continued to have some difficulties with emotional regulation although improved with continued  titration of Zyprexa   Leading up to discharge, the patient reported stable mood, denied suicidal or homicidal ideation for more than 48 hours, and denied hallucinations or other psychotic symptoms. On day of discharge, patient reported improvement with mood lability and toleration of stressors. The patient expressed motivation to continue prescribed medications and follow-up care, noting good response of target symptoms of psychosis and overall benefit from hospitalization. The patient was able to verbalize an individualized safety plan prior to discharge.  Mental Status Exam: Appearance: Appropriate for environment Behavior: Good eye contact, no psychomotor agitation noted Attitude: Cooperative, polite Speech: Clear and coherent, normal tone and volume Mood: Good Affect: Congruent, full range Thought Process: Logical, goal-directed Thought Content: WNL, excited to discharge to mother's home and work on psychologist, educational and celebrate birthday SI/HI: Denies Perceptions: Denies AVH, does not appear to responding to internal stimuli Judgement: Fair Insight: Fair Fund of Knowledge: WNL  Physical Exam  General: Pleasant, well-appearing. No acute distress. Pulmonary: Normal effort on room air.  Skin: No obvious rash or lesions. Neuro: A&Ox3.No focal deficits. MSK: Normal gait and station.  Review of Systems  Denies symptoms of headache, dizziness, abdominal pain or other somatic complaints  Blood pressure 114/83, pulse 85, temperature 98.1 F (36.7 C), temperature source Oral, resp. rate 14, height 5' 7 (1.702 m), weight 60.3 kg, SpO2 98%. Body mass index is 20.83 kg/m.  Assets  Assets: Resilience and physical health  Social History   Tobacco Use  Smoking Status Every Day  Smokeless Tobacco Never   Tobacco Cessation:  A prescription for an FDA-approved tobacco cessation medication provided at discharge  Metabolic Disorder Labs:  Lab Results  Component Value Date   HGBA1C 5.6 09/09/2024    MPG 114 09/09/2024   No results found for: PROLACTIN No results found for: CHOL, TRIG, HDL, CHOLHDL, VLDL, LDLCALC  Is patient on  multiple antipsychotic therapies at discharge:  No   Has Patient had three or more failed trials of antipsychotic monotherapy by history:  No  Recommended Plan for Multiple Antipsychotic Therapies: NA   Allergies as of 09/21/2024       Reactions   Other    Pt states as a child he took a pain medicine and it made him sick. Cannot remember what med or class it was.         Medication List     TAKE these medications      Indication  nicotine  14 mg/24hr patch Commonly known as: NICODERM CQ  - dosed in mg/24 hours Place 1 patch (14 mg total) onto the skin daily.  Indication: Nicotine  Addiction   OLANZapine  10 MG tablet Commonly known as: ZYPREXA  Take 1 tablet (10 mg total) by mouth 3 (three) times daily.  Indication: Schizophrenia        Follow-up Information     [facility] Follow up on 09/24/2024.   Why: You have a hospital follow up appointment for therapy and medication management services on 09/24/24 at 8:00 am.  The appointment will be Virtual, telehealth. Contact information: [address]  [address] [place] [address] [phone]                 Discharge recommendations:  Continue psychiatric medications as prescribed. Follow up with outpatient psychiatric provider and primary care physician as scheduled. Abstain from or limit alcohol, illicit drugs, and tobacco due to their negative impact on psychiatric and medical health. In the event of worsening symptoms, the patient is instructed to call the national crisis hotline (988), 911, or go the the closest ED for appropriate evaluation and treatment of symptoms.    Alfornia Light, DO, PGY-1 09/21/2024, 12:34 PM

## 2024-09-21 NOTE — Transportation (Signed)
 09/21/2024  Maven Curtistine Daring DOB: [birth year] MRN: [id number]   RIDER WAIVER AND RELEASE OF LIABILITY  For the purposes of helping with transportation needs, [facility] partners with outside transportation providers (taxi companies, [place], catering manager.) to give [facility] patients or other approved people the choice of on-demand rides Public Librarian) to our buildings for non-emergency visits.  By using [employer], I, the person signing this document, on behalf of myself and/or any legal minors (in my care using the [employer]), agree:  Science Writer given to me are supplied by independent, outside transportation providers who do not work for, or have any affiliation with, [employer]. [place] is not a transportation company. [facility] has no control over the quality or safety of the rides I get using [employer]. [facility] has no control over whether any outside ride will happen on time or not. [facility] gives no guarantee on the reliability, quality, safety, or availability on any rides, or that no mistakes will happen. I know and accept that traveling by vehicle (car, truck, SVU, fleeta, bus, taxi, etc.) has risks of serious injuries such as disability, being paralyzed, and death. I know and agree the risk of using [employer] is mine alone, and not [employer]. [employer] are provided as is and as are available. The transportation providers are in charge for all inspections and care of the vehicles used to provide these rides. I agree not to take legal action against [facility], its agents, employees, officers, directors, representatives, insurers, attorneys, assigns, successors, subsidiaries, and affiliates at any time for any reasons related directly or indirectly to using [employer]. I also agree not to take legal action against [facility] or its affiliates for any injury, death, or damage to property caused by or related to  using [employer]. I have read this Waiver and Release of Liability, and I understand the terms used in it and their legal meaning. This Waiver is freely and voluntarily given with the understanding that my right (or any legal minors) to legal action against [place] relating to [employer] is knowingly given up to use these services.   I attest that I read the Ride Waiver and Release of Liability to Harman Curtistine Daring, gave Mr. Runnion the opportunity to ask questions and answered the questions asked (if any). I affirm that Harman Curtistine Daring then provided consent for assistance with transportation.       Freeda Spivey, LCSWA 09/21/2024

## 2024-09-21 NOTE — Progress Notes (Signed)
 D: Pt A & O X 3. Denies SI, HI, AVH and pain at this time. D/C home as ordered. Taxi voucher given for transportation. A: D/C instructions reviewed with pt including prescriptions, medication samples and follow up appointment; compliance encouraged. All belongings from assigned locker returned to pt at time of departure. Scheduled medications given with verbal education and effects monitored. Safety checks maintained without incident till time of d/c.  R: Pt receptive to care. Compliant with medications when offered. Denies adverse drug reactions when assessed. Verbalized understanding related to d/c instructions. Signed belonging sheet in agreement with items received from locker. Ambulatory with a steady gait. Appears to be in no physical distress at time of departure.

## 2024-09-21 NOTE — Progress Notes (Addendum)
  [facility] Adult Case Management Discharge Plan :  Will you be returning to the same living situation after discharge:  Yes,  patient will return to his mom's home, [place] [phone] At discharge, do you have transportation home?: Yes,  CSW arranged transportation through [facility] for 10 AM Do you have the ability to pay for your medications: No.  Release of information consent forms completed and in the chart;  Patient's signature needed at discharge.  Patient to Follow up at:  Follow-up Information     [facility] Follow up on 09/24/2024.   Why: You have a hospital follow up appointment for therapy and medication management services on 09/24/24 at 8:00 am.  The appointment will be Virtual, telehealth. Contact information: [address]  [address] [place] [address] [phone]                 Next level of care provider has access to [facility] Link:no  Safety Planning and Suicide Prevention discussed: Yes,  [place] (mom) [phone] and with patient  Has patient been referred to the Quitline?: Patient refused referral for treatment  Patient has been referred for addiction treatment: Patient refused referral for treatment.   Dametra Whetsel O Hermann Dottavio, LCSWA 09/21/2024, 8:03 AM

## 2024-09-21 NOTE — [facility] Group Notes (Signed)
 Adult Psychoeducational Group Note  Date:  09/21/2024 Time:  10:19 AM  Group Topic/Focus: Goals Group Goals Group:   The focus of this group is to help patients establish daily goals to achieve during treatment and discuss how the patient can incorporate goal setting into their daily lives to aide in recovery.  Participation Level:  Did Not Attend  Participation Quality:    Affect:    Cognitive:    Insight:   Engagement in Group:    Modes of Intervention:    Additional Comments:    Annett Berle Hoyer 09/21/2024, 10:19 AM
